# Patient Record
Sex: Female | Born: 1935 | Race: White | Hispanic: No | Smoking: Never smoker
Health system: Southern US, Community
[De-identification: ages and names within clinical notes are randomized; demographics above are authoritative.]

## PROBLEM LIST (undated history)

## (undated) DIAGNOSIS — F0391 Unspecified dementia with behavioral disturbance: Secondary | ICD-10-CM

## (undated) DIAGNOSIS — E079 Disorder of thyroid, unspecified: Secondary | ICD-10-CM

## (undated) DIAGNOSIS — K219 Gastro-esophageal reflux disease without esophagitis: Secondary | ICD-10-CM

## (undated) DIAGNOSIS — I442 Atrioventricular block, complete: Secondary | ICD-10-CM

## (undated) DIAGNOSIS — I10 Essential (primary) hypertension: Secondary | ICD-10-CM

## (undated) DIAGNOSIS — F419 Anxiety disorder, unspecified: Secondary | ICD-10-CM

## (undated) DIAGNOSIS — D649 Anemia, unspecified: Secondary | ICD-10-CM

## (undated) HISTORY — PX: CARDIAC PACEMAKER PLACEMENT: SHX583

---

## 2016-11-09 ENCOUNTER — Emergency Department: Payer: Medicare Other

## 2016-11-09 ENCOUNTER — Encounter: Payer: Self-pay | Admitting: Emergency Medicine

## 2016-11-09 ENCOUNTER — Inpatient Hospital Stay
Admission: EM | Admit: 2016-11-09 | Discharge: 2016-11-14 | DRG: 871 | Disposition: A | Discharge status: Skilled Nursing Facility | Payer: Medicare Other | Attending: Specialist | Admitting: Specialist

## 2016-11-09 DIAGNOSIS — E039 Hypothyroidism, unspecified: Secondary | ICD-10-CM | POA: Diagnosis present

## 2016-11-09 DIAGNOSIS — A4181 Sepsis due to Enterococcus: Secondary | ICD-10-CM | POA: Diagnosis present

## 2016-11-09 DIAGNOSIS — E870 Hyperosmolality and hypernatremia: Secondary | ICD-10-CM | POA: Diagnosis present

## 2016-11-09 DIAGNOSIS — K219 Gastro-esophageal reflux disease without esophagitis: Secondary | ICD-10-CM | POA: Diagnosis present

## 2016-11-09 DIAGNOSIS — L089 Local infection of the skin and subcutaneous tissue, unspecified: Secondary | ICD-10-CM

## 2016-11-09 DIAGNOSIS — I10 Essential (primary) hypertension: Secondary | ICD-10-CM | POA: Diagnosis present

## 2016-11-09 DIAGNOSIS — R1310 Dysphagia, unspecified: Secondary | ICD-10-CM | POA: Diagnosis present

## 2016-11-09 DIAGNOSIS — F0391 Unspecified dementia with behavioral disturbance: Secondary | ICD-10-CM | POA: Diagnosis not present

## 2016-11-09 DIAGNOSIS — E876 Hypokalemia: Secondary | ICD-10-CM | POA: Diagnosis present

## 2016-11-09 DIAGNOSIS — E860 Dehydration: Secondary | ICD-10-CM | POA: Diagnosis present

## 2016-11-09 DIAGNOSIS — F329 Major depressive disorder, single episode, unspecified: Secondary | ICD-10-CM | POA: Diagnosis present

## 2016-11-09 DIAGNOSIS — E43 Unspecified severe protein-calorie malnutrition: Secondary | ICD-10-CM | POA: Insufficient documentation

## 2016-11-09 DIAGNOSIS — A419 Sepsis, unspecified organism: Secondary | ICD-10-CM | POA: Diagnosis present

## 2016-11-09 DIAGNOSIS — Z79899 Other long term (current) drug therapy: Secondary | ICD-10-CM | POA: Diagnosis not present

## 2016-11-09 DIAGNOSIS — Z95810 Presence of automatic (implantable) cardiac defibrillator: Secondary | ICD-10-CM | POA: Diagnosis not present

## 2016-11-09 DIAGNOSIS — Z66 Do not resuscitate: Secondary | ICD-10-CM | POA: Diagnosis present

## 2016-11-09 DIAGNOSIS — R509 Fever, unspecified: Secondary | ICD-10-CM | POA: Diagnosis not present

## 2016-11-09 DIAGNOSIS — Z8249 Family history of ischemic heart disease and other diseases of the circulatory system: Secondary | ICD-10-CM | POA: Diagnosis not present

## 2016-11-09 DIAGNOSIS — I959 Hypotension, unspecified: Secondary | ICD-10-CM | POA: Diagnosis present

## 2016-11-09 DIAGNOSIS — Z7401 Bed confinement status: Secondary | ICD-10-CM

## 2016-11-09 DIAGNOSIS — D649 Anemia, unspecified: Secondary | ICD-10-CM | POA: Diagnosis present

## 2016-11-09 DIAGNOSIS — K1120 Sialoadenitis, unspecified: Secondary | ICD-10-CM | POA: Diagnosis present

## 2016-11-09 DIAGNOSIS — I348 Other nonrheumatic mitral valve disorders: Secondary | ICD-10-CM | POA: Diagnosis not present

## 2016-11-09 DIAGNOSIS — J690 Pneumonitis due to inhalation of food and vomit: Secondary | ICD-10-CM

## 2016-11-09 HISTORY — DX: Gastro-esophageal reflux disease without esophagitis: K21.9

## 2016-11-09 HISTORY — DX: Anemia, unspecified: D64.9

## 2016-11-09 HISTORY — DX: Anxiety disorder, unspecified: F41.9

## 2016-11-09 HISTORY — DX: Essential (primary) hypertension: I10

## 2016-11-09 HISTORY — DX: Unspecified dementia, unspecified severity, with behavioral disturbance: F03.91

## 2016-11-09 HISTORY — DX: Disorder of thyroid, unspecified: E07.9

## 2016-11-09 HISTORY — DX: Atrioventricular block, complete: I44.2

## 2016-11-09 LAB — COMPREHENSIVE METABOLIC PANEL
ALK PHOS: 102 U/L (ref 38–126)
ALT: 10 U/L — AB (ref 14–54)
AST: 25 U/L (ref 15–41)
Albumin: 2.7 g/dL — ABNORMAL LOW (ref 3.5–5.0)
Anion gap: 8 (ref 5–15)
BILIRUBIN TOTAL: 0.9 mg/dL (ref 0.3–1.2)
BUN: 30 mg/dL — ABNORMAL HIGH (ref 6–20)
CALCIUM: 8.8 mg/dL — AB (ref 8.9–10.3)
CO2: 20 mmol/L — ABNORMAL LOW (ref 22–32)
CREATININE: 0.91 mg/dL (ref 0.44–1.00)
Chloride: 110 mmol/L (ref 101–111)
GFR, EST AFRICAN AMERICAN: 60 mL/min — AB (ref >60)
GFR, EST NON AFRICAN AMERICAN: 52 mL/min — AB (ref >60)
Glucose, Bld: 105 mg/dL — ABNORMAL HIGH (ref 65–99)
Potassium: 3.5 mmol/L (ref 3.5–5.1)
Sodium: 138 mmol/L (ref 135–145)
Total Protein: 6.8 g/dL (ref 6.5–8.1)

## 2016-11-09 LAB — CBC WITH DIFFERENTIAL/PLATELET
Basophils Absolute: 0.4 10*3/uL — ABNORMAL HIGH (ref 0–0.1)
Basophils Relative: 1 %
EOS PCT: 0 %
Eosinophils Absolute: 0 10*3/uL (ref 0–0.7)
HEMATOCRIT: 28.3 % — AB (ref 35.0–47.0)
HEMOGLOBIN: 8.8 g/dL — AB (ref 12.0–16.0)
LYMPHS ABS: 0.6 10*3/uL — AB (ref 1.0–3.6)
LYMPHS PCT: 2 %
MCH: 25 pg — AB (ref 26.0–34.0)
MCHC: 31.2 g/dL — ABNORMAL LOW (ref 32.0–36.0)
MCV: 80.3 fL (ref 80.0–100.0)
Monocytes Absolute: 0.9 10*3/uL (ref 0.2–0.9)
Monocytes Relative: 2 %
NEUTROS ABS: 36.5 10*3/uL — AB (ref 1.4–6.5)
NEUTROS PCT: 95 %
Platelets: 291 10*3/uL (ref 150–440)
RBC: 3.52 MIL/uL — AB (ref 3.80–5.20)
RDW: 17.2 % — ABNORMAL HIGH (ref 11.5–14.5)
WBC: 38.5 10*3/uL — AB (ref 3.6–11.0)

## 2016-11-09 LAB — LACTIC ACID, PLASMA: Lactic Acid, Venous: 1.4 mmol/L (ref 0.5–1.9)

## 2016-11-09 LAB — PROCALCITONIN: Procalcitonin: 0.72 ng/mL

## 2016-11-09 MED ORDER — ACETAMINOPHEN 325 MG PO TABS: 650.0000 mg | ORAL_TABLET | Freq: Four times a day (QID) | ORAL | Status: DC | PRN

## 2016-11-09 MED ORDER — IPRATROPIUM-ALBUTEROL 0.5-2.5 (3) MG/3ML IN SOLN
3.0000 mL | Freq: Four times a day (QID) | RESPIRATORY_TRACT | Status: DC
  Administered 2016-11-09 – 2016-11-11 (×7): 3 mL via RESPIRATORY_TRACT
  Filled 2016-11-09 (×7): qty 3

## 2016-11-09 MED ORDER — SODIUM CHLORIDE 0.9 % IV SOLN
3.0000 g | Freq: Two times a day (BID) | INTRAVENOUS | Status: DC
  Administered 2016-11-10: 3 g via INTRAVENOUS
  Filled 2016-11-09 (×2): qty 3

## 2016-11-09 MED ORDER — QUETIAPINE FUMARATE 100 MG PO TABS
150.0000 mg | ORAL_TABLET | Freq: Every day | ORAL | Status: DC
  Administered 2016-11-12 – 2016-11-13 (×2): 150 mg via ORAL
  Filled 2016-11-09: qty 1.5
  Filled 2016-11-09: qty 6
  Filled 2016-11-09: qty 2
  Filled 2016-11-09: qty 6

## 2016-11-09 MED ORDER — DEXAMETHASONE SODIUM PHOSPHATE 10 MG/ML IJ SOLN
10.0000 mg | Freq: Three times a day (TID) | INTRAMUSCULAR | Status: DC
  Administered 2016-11-09 – 2016-11-10 (×2): 10 mg via INTRAVENOUS
  Filled 2016-11-09 (×4): qty 1

## 2016-11-09 MED ORDER — LEVOTHYROXINE SODIUM 50 MCG PO TABS
50.0000 ug | ORAL_TABLET | Freq: Every day | ORAL | Status: DC
  Administered 2016-11-10 – 2016-11-14 (×5): 50 ug via ORAL
  Filled 2016-11-09 (×5): qty 1

## 2016-11-09 MED ORDER — NYSTATIN 100000 UNIT/GM EX CREA
1.0000 "application " | TOPICAL_CREAM | Freq: Two times a day (BID) | CUTANEOUS | Status: DC
  Administered 2016-11-09: 1 via TOPICAL
  Filled 2016-11-09: qty 15

## 2016-11-09 MED ORDER — IOPAMIDOL (ISOVUE-300) INJECTION 61%
75.0000 mL | Freq: Once | INTRAVENOUS | Status: AC | PRN
  Administered 2016-11-09: 75 mL via INTRAVENOUS

## 2016-11-09 MED ORDER — SODIUM CHLORIDE 0.9 % IV BOLUS (SEPSIS)
500.0000 mL | Freq: Once | INTRAVENOUS | Status: AC
  Administered 2016-11-09: 500 mL via INTRAVENOUS

## 2016-11-09 MED ORDER — POTASSIUM CHLORIDE IN NACL 20-0.9 MEQ/L-% IV SOLN
INTRAVENOUS | Status: DC
  Administered 2016-11-09 – 2016-11-11 (×2): via INTRAVENOUS
  Filled 2016-11-09 (×4): qty 1000

## 2016-11-09 MED ORDER — SODIUM CHLORIDE 0.9 % IV SOLN
3.0000 g | Freq: Once | INTRAVENOUS | Status: AC
  Administered 2016-11-09: 3 g via INTRAVENOUS
  Filled 2016-11-09: qty 3

## 2016-11-09 MED ORDER — ESCITALOPRAM OXALATE 10 MG PO TABS
10.0000 mg | ORAL_TABLET | Freq: Every day | ORAL | Status: DC
  Administered 2016-11-10 – 2016-11-14 (×5): 10 mg via ORAL
  Filled 2016-11-09 (×5): qty 1

## 2016-11-09 MED ORDER — ACETAMINOPHEN 650 MG RE SUPP: 650.0000 mg | Freq: Four times a day (QID) | RECTAL | Status: DC | PRN

## 2016-11-09 MED ORDER — DEXAMETHASONE SODIUM PHOSPHATE 10 MG/ML IJ SOLN
10.0000 mg | Freq: Once | INTRAMUSCULAR | Status: AC
  Administered 2016-11-09: 10 mg via INTRAVENOUS
  Filled 2016-11-09 (×2): qty 1

## 2016-11-09 NOTE — ED Notes (Signed)
2nd half of the 500 cc bolus started (250 cc bags). Per [facility] nurse the pt likes to be called Christine [name] and also is a little hard of hearing in the rt ear.

## 2016-11-09 NOTE — ED Triage Notes (Signed)
Pt was sent from [facility] to see an ent at [facility] today. Per report pt has had a changed of mental status x 3 days and sore throat.

## 2016-11-09 NOTE — ED Notes (Signed)
Hold atb until blood cultures drawn.

## 2016-11-09 NOTE — Consult Note (Signed)
..   Christine [name], Christine [name] [birth year] Christine SemenGraydon Goodman, MD  Reason for Consult: sore throat,   HPI: 82 y.o. Female with history of dementia presented to my office from PCP for evaluation of "fever, wheezing, stridor, purulence from mouth".  Patient is a resident of [facility] and provider with patient reported at my office her altered mental status was a change from normal.  Due to AMS, fever, and signs of signs of infection, EMS called to transport patient to ED for evaluation and management.  Patient has undergone CT scan and labwork showing significant leukocytosis and anemia.  Allergies: No Known Allergies  ROS: Review of systems normal other than 12 systems except per HPI.  PMH:  Past Medical History:  Diagnosis Date  . Anemia   . Anxiety   . Atrioventricular block, complete (HCC)   . GERD (gastroesophageal reflux disease)   . Hypertension   . Thyroid disease   . Unspecified dementia with behavioral disturbance     FH: History reviewed. No pertinent family history.  SH:  Social History   Social History  . Marital status: Widowed    Spouse name: N/A  . Number of children: N/A  . Years of education: N/A   Occupational History  . Not on file.   Social History Main Topics  . Smoking status: Unknown If Ever Smoked  . Smokeless tobacco: Not on file  . Alcohol use No  . Drug use: No  . Sexual activity: Not on file   Other Topics Concern  . Not on file   Social History Narrative  . No narrative on file    PSH:  Past Surgical History:  Procedure Laterality Date  . CARDIAC PACEMAKER PLACEMENT      Physical  Exam:  GEN-  Somnolent female, supine in bed EARS- external ears clear OC/OP-  Severe xerostomia, right tonsil with purulence and erythema, limited evaluation due to patient's cooperation NECK-  Right submandibular induration and erythema, no obvious fluctuance RESP- unlabored  CT scan reviewed-  Right oropharyngeal, floor of mouth, and neck edema  and hypodensity consistent with phlegmon   A/P: Probably right submandibular sialoadenitis secondary to severe xerostomia  Plan:  1)  Recommend admit to Medicine given medical status 2)  Fluid hydration with IV abx. 3)  Agree with IV abx (unasyn vs vanc) 4)  Decadron IV 5)  Warm compresses 6)  Sialogogues once more alert and able to tolerate PO.   Tessy Pawelski 11/09/2016 5:26 PM

## 2016-11-09 NOTE — Progress Notes (Signed)
Pharmacy Antibiotic Note  Christine [name] is a 82 y.o. female admitted on 11/09/2016 with Aspiration  pneumonia.  Pharmacy has been consulted for Unasyn dosing. Patient received Unasyn 3gm IV x1 dose in ED  Plan: Will start patient on Unasyn 3g IV every 12 hours based on current CrCl <8230ml/min.  Will monitor renal function and adjust dose as needed.   Height: 5\' 3"  (160 cm) Weight: 109 lb 8 oz (49.7 kg) IBW/kg (Calculated) : 52.4  Temp (24hrs), Avg:97.5 F (36.4 C), Min:97.5 F (36.4 C), Max:97.5 F (36.4 C)   Recent Labs Lab 11/09/16 1549  WBC 38.5*  CREATININE 0.91    Estimated Creatinine Clearance: 28.4 mL/min (by C-G formula based on SCr of 0.91 mg/dL).    No Known Allergies  Antimicrobials this admission: 12/14 Unasyn  >>   Dose adjustments this admission:   Microbiology results: 12/14 BCx: pending  Thank you for allowing pharmacy to be a part of this patient's care.  Gardner CandleSheema M Maximum Reiland, PharmD, BCPS Clinical Pharmacist 11/09/2016 6:04 PM

## 2016-11-09 NOTE — H&P (Signed)
[facility] [place] [facility]   PATIENT NAME: Christine LeffBertha [name]    MR#:  [id number]  DATE OF BIRTH:  August 04, 1918  DATE OF ADMISSION:  11/09/2016  PRIMARY CARE PHYSICIAN: Dorothey BasemanAVID BRONSTEIN, MD   REQUESTING/REFERRING PHYSICIAN: Dr Tobe SosGraden Goodman  CHIEF COMPLAINT:   Chief Complaint  Patient presents with  . Neck Pain    HISTORY OF PRESENT ILLNESS:  Christine [name]  is a 82 y.o. female with a known history of Dementia, pacemaker. Patient unable to give any history at this time. Daughter is currently in the [place] on vacation. On Tuesday she started getting hoarse. On Wednesday wheeze and a cough. Chest x-ray showed pneumonitis and was started on a Z-Pak and breathing treatments. This a.m. they noticed some swelling and edema around her neck and she is spitting up cottage cheese-type material. They sent her to ENT Dr. Gregary CromerVaught's office and she was sent into the ER for admission. In the ER blood pressure was on the lower side, white count elevated and tachycardic and hospitalist services were contacted for admission area normally she is able to communicate but is total care as per nurse at the facility.  PAST MEDICAL HISTORY:   Past Medical History:  Diagnosis Date  . Anemia   . Anxiety   . Atrioventricular block, complete (HCC)   . GERD (gastroesophageal reflux disease)   . Hypertension   . Thyroid disease   . Unspecified dementia with behavioral disturbance     PAST SURGICAL HISTORY:   Past Surgical History:  Procedure Laterality Date  . CARDIAC PACEMAKER PLACEMENT      SOCIAL HISTORY:   Social History  Substance Use Topics  . Smoking status: Never Smoker  . Smokeless tobacco: Never Used  . Alcohol use No    FAMILY HISTORY:   Family History  Problem Relation Age of Onset  . Hypertension Mother     DRUG ALLERGIES:  No Known Allergies  REVIEW OF SYSTEMS:  Unable to obtain at this time  MEDICATIONS AT HOME:   Prior to Admission  medications   Medication Sig Start Date End Date Taking? Authorizing Provider  acetaminophen (TYLENOL) 500 MG tablet Take 1,000 mg by mouth every 6 (six) hours as needed.   Yes Historical Provider, MD  amLODipine (NORVASC) 2.5 MG tablet Take 2.5 mg by mouth daily.   Yes Historical Provider, MD  apixaban (ELIQUIS) 2.5 MG TABS tablet Take 2.5 mg by mouth 2 (two) times daily.   Yes Historical Provider, MD  azithromycin (ZITHROMAX) 250 MG tablet Take by mouth daily. 11/08/16 11/12/16 Yes Historical Provider, MD  calcium carbonate (OSCAL) 1500 (600 Ca) MG TABS tablet Take by mouth 2 (two) times daily with a meal.   Yes Historical Provider, MD  cholecalciferol (VITAMIN D) 1000 units tablet Take 1,000 Units by mouth daily.   Yes Historical Provider, MD  Cranberry 450 MG CAPS Take 1 tablet by mouth daily.   Yes Historical Provider, MD  escitalopram (LEXAPRO) 10 MG tablet Take 10 mg by mouth daily.   Yes Historical Provider, MD  famotidine (PEPCID) 10 MG tablet Take 10 mg by mouth 2 (two) times daily.   Yes Historical Provider, MD  ferrous sulfate 325 (65 FE) MG tablet Take 325 mg by mouth 3 (three) times daily with meals.   Yes Historical Provider, MD  ipratropium-albuterol (DUONEB) 0.5-2.5 (3) MG/3ML SOLN Take 3 mLs by nebulization every 4 (four) hours as needed.   Yes Historical Provider, MD  levothyroxine (SYNTHROID, LEVOTHROID) 50  MCG tablet Take 50 mcg by mouth daily before breakfast.   Yes Historical Provider, MD  Melatonin 3 MG TABS Take by mouth.   Yes Historical Provider, MD  Multiple Vitamins-Minerals (MULTIVITAMIN WITH MINERALS) tablet Take 1 tablet by mouth daily.   Yes Historical Provider, MD  nitrofurantoin, macrocrystal-monohydrate, (MACROBID) 100 MG capsule Take 100 mg by mouth 2 (two) times daily.   Yes Historical Provider, MD  Nutritional Supplements (NUTRITIONAL SHAKE PO) Take 4 oz by mouth 3 (three) times daily.   Yes Historical Provider, MD  nystatin cream (MYCOSTATIN) Apply 1  application topically 2 (two) times daily.   Yes Historical Provider, MD  QUEtiapine (SEROQUEL) 100 MG tablet Take 100 mg by mouth 2 (two) times daily.   Yes Historical Provider, MD  QUEtiapine (SEROQUEL) 300 MG tablet Take 150 mg by mouth at bedtime.   Yes Historical Provider, MD  senna-docusate (SENOKOT-S) 8.6-50 MG tablet Take 1 tablet by mouth 2 (two) times daily.   Yes Historical Provider, MD  triamcinolone (KENALOG) 0.025 % cream Apply 1 application topically 2 (two) times daily.   Yes Historical Provider, MD  vitamin B-12 (CYANOCOBALAMIN) 1000 MCG tablet Take 1,000 mcg by mouth daily.   Yes Historical Provider, MD      VITAL SIGNS:  Blood pressure (!) 98/53, pulse (!) 58, temperature 97.5 F (36.4 C), temperature source Oral, resp. rate 20, height 5\' 3"  (1.6 m), weight 49.7 kg (109 lb 8 oz), SpO2 99 %.  PHYSICAL EXAMINATION:  GENERAL:  82 y.o.-year-old patient lying in the bed with no acute distress.  EYES: Pupils equal, round, reactive to light. No scleral icterus.  HEENT: Head atraumatic, normocephalic. Throat dry. NECK:  Supple, no jugular venous distention. Swelling submandible bilaterally LUNGS: Decreased breath sounds bilaterally, expiratory wheezing. No rales,rhonchi or crepitation. No use of accessory muscles of respiration.  CARDIOVASCULAR: S1, S2 normal. No murmurs, rubs, or gallops.  ABDOMEN: Soft, nontender, nondistended. Bowel sounds present. No organomegaly or mass.  EXTREMITIES: No pedal edema, cyanosis, or clubbing.  NEUROLOGIC: Patient moved her own extremities PSYCHIATRIC: The patient is lethargic.  SKIN: No rash, lesion, or ulcer.   LABORATORY PANEL:   CBC  Recent Labs Lab 11/09/16 1549  WBC 38.5*  HGB 8.8*  HCT 28.3*  PLT 291   ------------------------------------------------------------------------------------------------------------------  Chemistries   Recent Labs Lab 11/09/16 1549  NA 138  K 3.5  CL 110  CO2 20*  GLUCOSE 105*  BUN 30*   CREATININE 0.91  CALCIUM 8.8*  AST 25  ALT 10*  ALKPHOS 102  BILITOT 0.9   ------------------------------------------------------------------------------------------------------------------    RADIOLOGY:  Ct Soft Tissue Neck W Contrast  Result Date: 11/09/2016 CLINICAL DATA:  Right neck swelling. Sore throat and altered mental status. EXAM: CT NECK WITH CONTRAST TECHNIQUE: Multidetector CT imaging of the neck was performed using the standard protocol following the bolus administration of intravenous contrast. CONTRAST:  75mL ISOVUE-300 IOPAMIDOL (ISOVUE-300) INJECTION 61% COMPARISON:  None. FINDINGS: Pharynx and larynx: Submucosal edema in the lower pharynx and supraglottic larynx. Mild mass effect on the right lateral pharynx by the right submandibular space process. Right tonsillar calcification. Salivary glands: There is extensive heterogeneous soft tissue throughout the right submandibular space with both hypoenhancing and hyperenhancing material suggestive of phlegmon. The right submandibular gland is not discretely identified. Inflammatory stranding extends into the subcutaneous soft tissues of the face and upper neck. Stranding also extends posteriorly into the carotid space. The airway remains patent. No discrete, well-defined drainable fluid collection is identified. The left  submandibular gland and right parotid gland are unremarkable. There is an 11 mm peripherally enhancing nodule in the superficial left parotid gland. A similar but smaller nodule measures 4 mm more inferiorly in the left parotid. The left parotid gland is slightly enlarged compared to the right. Thyroid: Unremarkable. Lymph nodes: Small but asymmetric right submandibular lymph nodes measure up to 5 mm in short axis. Small right level II lymph nodes measure up to 6 mm in short axis, and there are asymmetric right level III and IV lymph nodes which are sub 5 mm. These are all most likely reactive. Vascular: Major vascular  structures of the neck appear patent. Extensive calcified plaque is present at both carotid bifurcations with at least moderate proximal ICA stenosis on the right and mild-to-moderate stenosis on the left. Limited intracranial: Partially visualized cerebral atrophy. Visualized orbits: Unremarkable. Mastoids and visualized paranasal sinuses: Clear. Skeleton: Moderately advanced disc and facet degeneration in the cervical spine. T2 compression fracture with 70% anterior vertebral body height loss. This is chronic in appearance, with ankylosis between the T2 and T3 vertebral bodies anteriorly and without evidence of significant paravertebral hematoma. Posterior T2 vertebral body retropulsion measures 4 mm. Upper chest: Partially visualized heterogeneous lung opacities in the posterior left upper lobe and superior segment of the left lower lobe with small volume left pleural fluid. Aortic atherosclerosis. Right subclavian approach pacemaker. Subcentimeter mediastinal lymph nodes. Other: None. IMPRESSION: 1. Extensive phlegmonous type changes in the right submandibular space which may reflect marked infectious sialadenitis. No organized abscess identified. 2. Pharyngeal and supraglottic edema.  Patent airway. 3. Small nodules in the left parotid gland, the larger measuring 11 mm. This may reflect a primary parotid neoplasm, abnormal lymph node, or potentially focal inflammatory change in the setting of mild acute parotitis given mild asymmetric enlargement of the left parotid gland and edema extending into the left face. 4. Partially visualized lung opacities in the left upper and left lower lobes suspicious for pneumonia. Chest radiographs recommended. 5. Partially visualized left pleural effusion. 6. Aortic atherosclerosis. Carotid bifurcation atherosclerosis with right greater than left proximal ICA stenosis. 7. Moderate T2 compression fracture, chronic in appearance. Electronically Signed   By: Sebastian AcheAllen  Grady M.D.    On: 11/09/2016 17:34      IMPRESSION AND PLAN:   1. Clinical sepsis with aspiration pneumonia and submandibular infection. Patient also has leukocytosis, tachycardia and relative hypotension. I will give a fluid bolus and gentle IV fluids overnight. Antibiotics with IV Unasyn and IV Decadron. Appreciate ENT follow-up. Nothing by mouth and swallow evaluation tomorrow. Hold eliquis just in case surgical procedure. 2. Dehydration give IV fluid hydration overnight 3. Hypokalemia potassium in IV fluids 4. History of dementia with behavioral disturbance. 5. GERD on Pepcid 6. Hypothyroidism unspecified on levothyroxine 7. Anemia. Send off iron studies and stool guaiac. Watch closely with hydration  All the records are reviewed and case discussed with ED provider. Management plans discussed with the patient, family and they are in agreement.  CODE STATUS: DO NOT RESUSCITATE  TOTAL TIME TAKING CARE OF THIS PATIENT: 50 minutes.    Alford HighlandWIETING, Keilyn Haggard M.D on 11/09/2016 at 6:01 PM  Between 7am to 6pm - Pager - [phone]  After 6pm call admission pager [phone]  [facility] Office  [phone]  CC: Primary care physician; Dorothey BasemanAVID BRONSTEIN, MD

## 2016-11-09 NOTE — ED Provider Notes (Signed)
[facility] Emergency Department Provider Note   ____________________________________________   I have reviewed the triage vital signs and the nursing notes.   HISTORY  Chief Complaint Neck swelling  History limited by: poor historian   HPI Christine [name] is a 82 y.o. female who presents to the emergency department today from ENT clinic because of ENT clinic concern for AMS, however per report from living facility there has not been any AMS. Patient is at baseline. They were being sent to ENT for swelling to the right neck. Patient states that it is slightly painful. Unclear how long it has been present for. There is some associated erythema.   Past Medical History:  Diagnosis Date  . Anemia   . Anxiety   . Atrioventricular block, complete (HCC)   . GERD (gastroesophageal reflux disease)   . Hypertension   . Thyroid disease   . Unspecified dementia with behavioral disturbance     There are no active problems to display for this patient.   Past Surgical History:  Procedure Laterality Date  . CARDIAC PACEMAKER PLACEMENT      Prior to Admission medications   Not on File    Allergies Patient has no known allergies.  History reviewed. No pertinent family history.  Social History Social History  Substance Use Topics  . Smoking status: Unknown If Ever Smoked  . Smokeless tobacco: Not on file  . Alcohol use No    Review of Systems  Constitutional: Negative for fever. Cardiovascular: Negative for chest pain. Respiratory: Negative for shortness of breath. Gastrointestinal: Negative for abdominal pain, vomiting and diarrhea. Genitourinary: Negative for dysuria. Musculoskeletal: Positive for right neck swelling. Skin: Positive for erythema.  Neurological: Negative for headaches, focal weakness or numbness.  10-point ROS otherwise negative.  ____________________________________________   PHYSICAL EXAM:  VITAL SIGNS: ED Triage  Vitals  Enc Vitals Group     BP 11/09/16 1512 112/62     Pulse Rate 11/09/16 1512 96     Resp 11/09/16 1512 20     Temp 11/09/16 1512 97.5 F (36.4 C)     Temp Source 11/09/16 1512 Oral     SpO2 11/09/16 1512 98 %     Weight 11/09/16 1515 109 lb 8 oz (49.7 kg)     Height 11/09/16 1515 5\' 3"  (1.6 m)     Head Circumference --      [facility] --      Pain Score --      Pain Loc --      Pain Edu? --      Excl. in GC? --    Constitutional: Awake, alert. No acute distress.  Eyes: Conjunctivae are normal. Normal extraocular movements. ENT   Head: Normocephalic and atraumatic.   Nose: No congestion/rhinnorhea.   Mouth/Throat: Mucous membranes are moist.   Neck: No stridor. Right neck swelling, with some erythema overlying the area. Slightly tender to palpation. Hematological/Lymphatic/Immunilogical: No cervical lymphadenopathy. Cardiovascular: Normal rate, regular rhythm.  No murmurs, rubs, or gallops.  Respiratory: Normal respiratory effort without tachypnea nor retractions. Breath sounds are clear and equal bilaterally. No wheezes/rales/rhonchi. Gastrointestinal: Soft and non tender. No rebound. No guarding.  Genitourinary: Deferred Musculoskeletal: Normal range of motion in all extremities. No lower extremity edema. Neurologic:  Slightly hoarse voice. No gross focal neurologic deficits are appreciated.  Skin:  Skin is warm, dry and intact. No rash noted. Psychiatric: Mood and affect are normal. Speech and behavior are normal. Patient exhibits appropriate insight and judgment.  ____________________________________________    LABS (pertinent positives/negatives)  Labs Reviewed  CBC WITH DIFFERENTIAL/PLATELET - Abnormal; Notable for the following:       Result Value   WBC 38.5 (*)    RBC 3.52 (*)    Hemoglobin 8.8 (*)    HCT 28.3 (*)    MCH 25.0 (*)    MCHC 31.2 (*)    RDW 17.2 (*)    Neutro Abs 36.5 (*)    Lymphs Abs 0.6 (*)    Basophils Absolute 0.4 (*)     All other components within normal limits  COMPREHENSIVE METABOLIC PANEL - Abnormal; Notable for the following:    CO2 20 (*)    Glucose, Bld 105 (*)    BUN 30 (*)    Calcium 8.8 (*)    Albumin 2.7 (*)    ALT 10 (*)    GFR calc non Af Amer 52 (*)    GFR calc Af Amer 60 (*)    All other components within normal limits  CULTURE, BLOOD (ROUTINE X 2)  CULTURE, BLOOD (ROUTINE X 2)  PROCALCITONIN  LACTIC ACID, PLASMA  BASIC METABOLIC PANEL  CBC     ____________________________________________   EKG  None  ____________________________________________    RADIOLOGY  CT soft tissue neck IMPRESSION:  1. Extensive phlegmonous type changes in the right submandibular  space which may reflect marked infectious sialadenitis. No organized  abscess identified.  2. Pharyngeal and supraglottic edema. Patent airway.  3. Small nodules in the left parotid gland, the larger measuring 11  mm. This may reflect a primary parotid neoplasm, abnormal lymph  node, or potentially focal inflammatory change in the setting of  mild acute parotitis given mild asymmetric enlargement of the left  parotid gland and edema extending into the left face.  4. Partially visualized lung opacities in the left upper and left  lower lobes suspicious for pneumonia. Chest radiographs recommended.  5. Partially visualized left pleural effusion.  6. Aortic atherosclerosis. Carotid bifurcation atherosclerosis with  right greater than left proximal ICA stenosis.  7. Moderate T2 compression fracture, chronic in appearance.   ____________________________________________   PROCEDURES  Procedures  ____________________________________________   INITIAL IMPRESSION / ASSESSMENT AND PLAN / ED COURSE  Pertinent labs & imaging results that were available during my care of the patient were reviewed by me and considered in my medical decision making (see chart for details).  Patient presents to the emergency  department because of concern for right neck swelling. WBC quite elevated. Patient was seen by ENT who recommends admission and antibiotics. They will consider drainage tomorrow.  ____________________________________________   FINAL CLINICAL IMPRESSION(S) / ED DIAGNOSES  Final diagnoses:  Neck infection     Note: This dictation was prepared with Dragon dictation. Any transcriptional errors that result from this process are unintentional     Phineas SemenGraydon Sosha Shepherd, MD 11/09/16 2144

## 2016-11-09 NOTE — ED Notes (Signed)
unasyn started once blood cultures, ammonia and lactic acid labs all drawn.

## 2016-11-09 NOTE — ED Notes (Signed)
Spoke with [facility]. Per report the pt was sent over for the red hardened area on the rt side of neck and hoarse voice. Per report this is her normal mental state in which she yells out and is normally a&ox1.

## 2016-11-10 DIAGNOSIS — E43 Unspecified severe protein-calorie malnutrition: Secondary | ICD-10-CM | POA: Insufficient documentation

## 2016-11-10 LAB — CBC
HEMATOCRIT: 26.7 % — AB (ref 35.0–47.0)
HEMOGLOBIN: 8.4 g/dL — AB (ref 12.0–16.0)
MCH: 25.3 pg — ABNORMAL LOW (ref 26.0–34.0)
MCHC: 31.6 g/dL — AB (ref 32.0–36.0)
MCV: 80.2 fL (ref 80.0–100.0)
Platelets: 278 10*3/uL (ref 150–440)
RBC: 3.33 MIL/uL — ABNORMAL LOW (ref 3.80–5.20)
RDW: 17.5 % — AB (ref 11.5–14.5)
WBC: 22.1 10*3/uL — ABNORMAL HIGH (ref 3.6–11.0)

## 2016-11-10 LAB — BLOOD CULTURE ID PANEL (REFLEXED)
ACINETOBACTER BAUMANNII: NOT DETECTED
CANDIDA ALBICANS: NOT DETECTED
Candida glabrata: NOT DETECTED
Candida krusei: NOT DETECTED
Candida parapsilosis: NOT DETECTED
Candida tropicalis: NOT DETECTED
ENTEROBACTERIACEAE SPECIES: NOT DETECTED
ENTEROCOCCUS SPECIES: DETECTED — AB
Enterobacter cloacae complex: NOT DETECTED
Escherichia coli: NOT DETECTED
HAEMOPHILUS INFLUENZAE: NOT DETECTED
Klebsiella oxytoca: NOT DETECTED
Klebsiella pneumoniae: NOT DETECTED
Listeria monocytogenes: NOT DETECTED
NEISSERIA MENINGITIDIS: NOT DETECTED
PSEUDOMONAS AERUGINOSA: NOT DETECTED
Proteus species: NOT DETECTED
SERRATIA MARCESCENS: NOT DETECTED
STAPHYLOCOCCUS AUREUS BCID: NOT DETECTED
STAPHYLOCOCCUS SPECIES: NOT DETECTED
STREPTOCOCCUS PNEUMONIAE: NOT DETECTED
STREPTOCOCCUS PYOGENES: NOT DETECTED
STREPTOCOCCUS SPECIES: NOT DETECTED
Streptococcus agalactiae: NOT DETECTED
Vancomycin resistance: NOT DETECTED

## 2016-11-10 LAB — BASIC METABOLIC PANEL
Anion gap: 8 (ref 5–15)
BUN: 27 mg/dL — AB (ref 6–20)
CHLORIDE: 113 mmol/L — AB (ref 101–111)
CO2: 19 mmol/L — ABNORMAL LOW (ref 22–32)
Calcium: 8.3 mg/dL — ABNORMAL LOW (ref 8.9–10.3)
Creatinine, Ser: 0.8 mg/dL (ref 0.44–1.00)
GFR calc Af Amer: >60 mL/min (ref >60)
GFR calc non Af Amer: >60 mL/min (ref >60)
GLUCOSE: 131 mg/dL — AB (ref 65–99)
POTASSIUM: 4 mmol/L (ref 3.5–5.1)
Sodium: 140 mmol/L (ref 135–145)

## 2016-11-10 LAB — MRSA PCR SCREENING: MRSA by PCR: POSITIVE — AB

## 2016-11-10 MED ORDER — SODIUM CHLORIDE 0.9 % IV SOLN
3.0000 g | Freq: Four times a day (QID) | INTRAVENOUS | Status: DC
  Administered 2016-11-10 – 2016-11-14 (×17): 3 g via INTRAVENOUS
  Filled 2016-11-10 (×21): qty 3

## 2016-11-10 MED ORDER — PNEUMOCOCCAL VAC POLYVALENT 25 MCG/0.5ML IJ INJ
0.5000 mL | INJECTION | INTRAMUSCULAR | Status: DC
  Filled 2016-11-10: qty 0.5

## 2016-11-10 MED ORDER — DEXAMETHASONE SODIUM PHOSPHATE 4 MG/ML IJ SOLN
4.0000 mg | Freq: Three times a day (TID) | INTRAMUSCULAR | Status: DC
  Administered 2016-11-10 – 2016-11-14 (×12): 4 mg via INTRAVENOUS
  Filled 2016-11-10 (×12): qty 1

## 2016-11-10 MED ORDER — MUPIROCIN 2 % EX OINT
1.0000 "application " | TOPICAL_OINTMENT | Freq: Two times a day (BID) | CUTANEOUS | Status: DC
  Administered 2016-11-10 – 2016-11-14 (×9): 1 via NASAL
  Filled 2016-11-10: qty 22

## 2016-11-10 MED ORDER — VANCOMYCIN HCL IN DEXTROSE 750-5 MG/150ML-% IV SOLN
750.0000 mg | INTRAVENOUS | Status: DC
  Filled 2016-11-10: qty 150

## 2016-11-10 MED ORDER — ORAL CARE MOUTH RINSE
15.0000 mL | Freq: Two times a day (BID) | OROMUCOSAL | Status: DC
  Administered 2016-11-10 – 2016-11-14 (×7): 15 mL via OROMUCOSAL

## 2016-11-10 MED ORDER — ENOXAPARIN SODIUM 40 MG/0.4ML ~~LOC~~ SOLN
40.0000 mg | SUBCUTANEOUS | Status: DC
  Administered 2016-11-10 – 2016-11-13 (×4): 40 mg via SUBCUTANEOUS
  Filled 2016-11-10 (×4): qty 0.4

## 2016-11-10 MED ORDER — CHLORHEXIDINE GLUCONATE CLOTH 2 % EX PADS
6.0000 | MEDICATED_PAD | Freq: Every day | CUTANEOUS | Status: AC
  Administered 2016-11-10 – 2016-11-14 (×5): 6 via TOPICAL

## 2016-11-10 NOTE — Progress Notes (Addendum)
Initial Nutrition Assessment  DOCUMENTATION CODES:   Severe malnutrition in context of chronic illness  INTERVENTION:   Magic cup TID with meals, each supplement provides 290 kcal and 9 grams of protein  Mighty Shake II TID with meals, each supplement provides 480-500 kcals and 20-23 grams of protein  NUTRITION DIAGNOSIS:   Malnutrition related to lethargy/confusion, chronic illness as evidenced by moderate depletion of body fat, severe depletion of muscle mass.  GOAL:   Patient will meet greater than or equal to 90% of their needs  MONITOR:   PO intake, Supplement acceptance  REASON FOR ASSESSMENT:   Malnutrition Screening Tool    ASSESSMENT:   82 y.o. female with a known history of Dementia & pacemaker. Admitted for pneumonia and swollen neck with possible abcess.    Met with pt in room today. Pt confused and screaming "let me sleep". Unable to obtain nutrition history for this pt. Pt currently NPO. Spoke to speech who recommends Magic Cups and [employer] for this pt. SLP reports pt does not like to have food placed in her mouth. No BM since admit. No weight history per chart. Unsure if any weight loss.    Medications reviewed and include: unasyn, dexamethasone, synthroid    Labs reviewed: CL 113(H), CO2 19(L), BUN 27(H), Ca 8.3(L) adj. 9.34 wnl, Alb 2.7(L) Wbc- 22.1(H), hgb 8.4(L), Hct 26.7(L)  Nutrition-Focused physical exam completed. Findings are mild/moderate fat depletion, severe muscle depletion, and no edema.   Diet Order:  DIET - DYS 1 Room service appropriate? No; Fluid consistency: Thin  Skin:  Reviewed, no issues  Last BM:  none since admit  Height:   Ht Readings from Last 1 Encounters:  11/09/16 5' 3"  (1.6 m)    Weight:   Wt Readings from Last 1 Encounters:  11/09/16 112 lb 2 oz (50.9 kg)    Ideal Body Weight:  52.2 kg  BMI:  Body mass index is 19.86 kg/m.  Estimated Nutritional Needs:   Kcal:  1400-1700kcal/day   Protein:   61-76g/day   Fluid:  >1.5L/day   EDUCATION NEEDS:   No education needs identified at this time  Koleen Distance, RD, LDN

## 2016-11-10 NOTE — Progress Notes (Signed)
Pharmacy Antibiotic Note  Christine LeffBertha [name] is a 82 y.o. female admitted on 11/09/2016 with Aspiration  pneumonia.  Pharmacy has been consulted for Unasyn dosing.   Plan: Slight improvement in SCr. Will increase Unasyn to 3 g iv q 6 hours.   Height: 5\' 3"  (160 cm) Weight: 112 lb 2 oz (50.9 kg) IBW/kg (Calculated) : 52.4  Temp (24hrs), Avg:97.5 F (36.4 C), Min:97.4 F (36.3 C), Max:97.6 F (36.4 C)   Recent Labs Lab 11/09/16 1549 11/09/16 1827 11/10/16 0447  WBC 38.5*  --  22.1*  CREATININE 0.91  --  0.80  LATICACIDVEN  --  1.4  --     Estimated Creatinine Clearance: 33 mL/min (by C-G formula based on SCr of 0.8 mg/dL).    No Known Allergies  Antimicrobials this admission: 12/14 Unasyn  >>   Dose adjustments this admission:  Microbiology results: 12/14 BCx: pending 12/15 MRSA PCR: positive  Thank you for allowing pharmacy to be a part of this patient's care.  Luisa HartScott Charmelle Soh, PharmD Clinical Pharmacist  11/10/2016 8:54 AM

## 2016-11-10 NOTE — Care Management Important Message (Signed)
Important Message  Patient Details  Name: Christine [name] MRN: [id number] Date of Birth: 1918/04/25   Medicare Important Message Given:  Yes    Gwenette GreetBrenda S Karandeep Resende, RN 11/10/2016, 10:19 AM

## 2016-11-10 NOTE — Consult Note (Signed)
[facility] Infectious Disease     Reason for Consult:Enterococcal bacteremia   Referring Physician: Karlton Lemon Date of Admission:  11/09/2016   Active Problems:   Sepsis ([place])   HPI: Christine [name] is a 82 y.o. female admitted from PCP with hoarseness, possible aspiration. She has seen ENt and had sialoadenitis dxed. Bcx + enterococcus. Started on unasyn. On admit wbc was 38, but afebrile She has dementia, HTN and does have a ppm in place.   Past Medical History:  Diagnosis Date  . Anemia   . Anxiety   . Atrioventricular block, complete ([place])   . GERD (gastroesophageal reflux disease)   . Hypertension   . Thyroid disease   . Unspecified dementia with behavioral disturbance    Past Surgical History:  Procedure Laterality Date  . CARDIAC PACEMAKER PLACEMENT     Social History  Substance Use Topics  . Smoking status: Never Smoker  . Smokeless tobacco: Never Used  . Alcohol use No   Family History  Problem Relation Age of Onset  . Hypertension Mother     Allergies: No Known Allergies  Current antibiotics: Antibiotics Given (last 72 hours)    Date/Time Action Medication Dose Rate   11/10/16 0559 Given   Ampicillin-Sulbactam (UNASYN) 3 g in sodium chloride 0.9 % 100 mL IVPB 3 g 200 mL/hr   11/10/16 1246 Given   Ampicillin-Sulbactam (UNASYN) 3 g in sodium chloride 0.9 % 100 mL IVPB 3 g 200 mL/hr      MEDICATIONS: . ampicillin-sulbactam (UNASYN) IV  3 g Intravenous Q6H  . Chlorhexidine Gluconate Cloth  6 each Topical Q0600  . dexamethasone  10 mg Intravenous Q8H  . escitalopram  10 mg Oral Daily  . ipratropium-albuterol  3 mL Nebulization Q6H  . levothyroxine  50 mcg Oral QAC breakfast  . mouth rinse  15 mL Mouth Rinse BID  . mupirocin ointment  1 application Nasal BID  . nystatin cream  1 application Topical BID  . [START ON 11/11/2016] pneumococcal 23 valent vaccine  0.5 mL Intramuscular Tomorrow-1000  . QUEtiapine  150 mg Oral QHS    Review of Systems  - unable to obtain  OBJECTIVE: Temp:  [97.4 F (36.3 C)-97.6 F (36.4 C)] 97.4 F (36.3 C) (12/15 0415) Pulse Rate:  [58-97] 90 (12/15 0415) Resp:  [20] 20 (12/15 0415) BP: (95-130)/(47-82) 115/65 (12/15 0415) SpO2:  [94 %-100 %] 94 % (12/15 1333) Weight:  [49.7 kg (109 lb 8 oz)-50.9 kg (112 lb 2 oz)] 50.9 kg (112 lb 2 oz) (12/14 1946) Physical Exam  Constitutional: frail, lying in bed, eyes closed saying I just want to sleep    HENT:anicteric Mouth/Throat: Oropharynx is clear but very dry R sub Mand region with firm warm, mildly tender swelling Cardiovascular: Normal rate, regular rhythm and normal heart sounds.  Distnat PPM site wnl Pulmonary/Chest: Effort normal and breath sounds normal. No respiratory distress.  has no wheezes.  Neck = supple, no nuchal rigidity Abdominal: Soft. Bowel sounds are normal.  exhibits no distension. There is no tenderness.  Lymphadenopathy: no cervical adenopathy. No axillary adenopathy Neurological: calling out that she just wants to sleep Skin: Skin is warm and dry. No rash noted. No erythema.  Psychiatric: a normal mood and affect.  behavior is normal.    LABS: Results for orders placed or performed during the hospital encounter of 11/09/16 (from the past 48 hour(s))  CBC with Differential     Status: Abnormal   Collection Time: 11/09/16  3:49 PM  Result Value Ref Range   WBC 38.5 (H) 3.6 - 11.0 K/uL   RBC 3.52 (L) 3.80 - 5.20 MIL/uL   Hemoglobin 8.8 (L) 12.0 - 16.0 g/dL   HCT 28.3 (L) 35.0 - 47.0 %   MCV 80.3 80.0 - 100.0 fL   MCH 25.0 (L) 26.0 - 34.0 pg   MCHC 31.2 (L) 32.0 - 36.0 g/dL   RDW 17.2 (H) 11.5 - 14.5 %   Platelets 291 150 - 440 K/uL   Neutrophils Relative % 95 %   Neutro Abs 36.5 (H) 1.4 - 6.5 K/uL   Lymphocytes Relative 2 %   Lymphs Abs 0.6 (L) 1.0 - 3.6 K/uL   Monocytes Relative 2 %   Monocytes Absolute 0.9 0.2 - 0.9 K/uL   Eosinophils Relative 0 %   Eosinophils Absolute 0.0 0 - 0.7 K/uL   Basophils Relative 1 %    Basophils Absolute 0.4 (H) 0 - 0.1 K/uL  Comprehensive metabolic panel     Status: Abnormal   Collection Time: 11/09/16  3:49 PM  Result Value Ref Range   Sodium 138 135 - 145 mmol/L   Potassium 3.5 3.5 - 5.1 mmol/L   Chloride 110 101 - 111 mmol/L   CO2 20 (L) 22 - 32 mmol/L   Glucose, Bld 105 (H) 65 - 99 mg/dL   BUN 30 (H) 6 - 20 mg/dL   Creatinine, Ser 0.91 0.44 - 1.00 mg/dL   Calcium 8.8 (L) 8.9 - 10.3 mg/dL   Total Protein 6.8 6.5 - 8.1 g/dL   Albumin 2.7 (L) 3.5 - 5.0 g/dL   AST 25 15 - 41 U/L   ALT 10 (L) 14 - 54 U/L   Alkaline Phosphatase 102 38 - 126 U/L   Total Bilirubin 0.9 0.3 - 1.2 mg/dL   GFR calc non Af Amer 52 (L) >60 mL/min   GFR calc Af Amer 60 (L) >60 mL/min    Comment: (NOTE) The eGFR has been calculated using the CKD EPI equation. This calculation has not been validated in all clinical situations. eGFR's persistently <60 mL/min signify possible Chronic Kidney Disease.    Anion gap 8 5 - 15  CULTURE, BLOOD (ROUTINE X 2) w Reflex to ID Panel     Status: None (Preliminary result)   Collection Time: 11/09/16  5:55 PM  Result Value Ref Range   Specimen Description BLOOD L AC    Special Requests      BOTTLES DRAWN AEROBIC AND ANAEROBIC ANA 16 AER 13ML   Culture  Setup Time      GRAM POSITIVE COCCI IN BOTH AEROBIC AND ANAEROBIC BOTTLES CRITICAL RESULT CALLED TO, READ BACK BY AND VERIFIED WITH: Hank Zompa @ 1046 11/10/16 by [place]    Culture [place]    Report Status PENDING   Blood Culture ID Panel (Reflexed)     Status: Abnormal   Collection Time: 11/09/16  5:55 PM  Result Value Ref Range   Enterococcus species DETECTED (A) NOT DETECTED    Comment: CRITICAL RESULT CALLED TO, READ BACK BY AND VERIFIED WITH: Hank Zompa @ 5056 11/10/16 by [place]    Vancomycin resistance NOT DETECTED NOT DETECTED   Listeria monocytogenes NOT DETECTED NOT DETECTED   Staphylococcus species NOT DETECTED NOT DETECTED   Staphylococcus aureus NOT DETECTED NOT DETECTED    Streptococcus species NOT DETECTED NOT DETECTED   Streptococcus agalactiae NOT DETECTED NOT DETECTED   Streptococcus pneumoniae NOT DETECTED NOT DETECTED   Streptococcus pyogenes NOT DETECTED  NOT DETECTED   Acinetobacter baumannii NOT DETECTED NOT DETECTED   Enterobacteriaceae species NOT DETECTED NOT DETECTED   Enterobacter cloacae complex NOT DETECTED NOT DETECTED   Escherichia coli NOT DETECTED NOT DETECTED   Klebsiella oxytoca NOT DETECTED NOT DETECTED   Klebsiella pneumoniae NOT DETECTED NOT DETECTED   Proteus species NOT DETECTED NOT DETECTED   Serratia marcescens NOT DETECTED NOT DETECTED   Haemophilus influenzae NOT DETECTED NOT DETECTED   Neisseria meningitidis NOT DETECTED NOT DETECTED   Pseudomonas aeruginosa NOT DETECTED NOT DETECTED   Candida albicans NOT DETECTED NOT DETECTED   Candida glabrata NOT DETECTED NOT DETECTED   Candida krusei NOT DETECTED NOT DETECTED   Candida parapsilosis NOT DETECTED NOT DETECTED   Candida tropicalis NOT DETECTED NOT DETECTED  CULTURE, BLOOD (ROUTINE X 2) w Reflex to ID Panel     Status: None (Preliminary result)   Collection Time: 11/09/16  6:00 PM  Result Value Ref Range   Specimen Description BLOOD  R HAND    Special Requests      BOTTLES DRAWN AEROBIC AND ANAEROBIC  ANA 3ML AER 5ML   Culture  Setup Time      GRAM POSITIVE COCCI AEROBIC BOTTLE ONLY CRITICAL VALUE NOTED.  VALUE IS CONSISTENT WITH PREVIOUSLY REPORTED AND CALLED VALUE.    Culture GRAM POSITIVE COCCI    Report Status PENDING   Procalcitonin - Baseline     Status: None   Collection Time: 11/09/16  6:27 PM  Result Value Ref Range   Procalcitonin 0.72 ng/mL    Comment:        Interpretation: PCT > 0.5 ng/mL and <= 2 ng/mL: Systemic infection (sepsis) is possible, but other conditions are known to elevate PCT as well. (NOTE)         ICU PCT Algorithm               Non ICU PCT Algorithm    ----------------------------     ------------------------------         PCT <  0.25 ng/mL                 PCT < 0.1 ng/mL     Stopping of antibiotics            Stopping of antibiotics       strongly encouraged.               strongly encouraged.    ----------------------------     ------------------------------       PCT level decrease by               PCT < 0.25 ng/mL       >= 80% from peak PCT       OR PCT 0.25 - 0.5 ng/mL          Stopping of antibiotics                                             encouraged.     Stopping of antibiotics           encouraged.    ----------------------------     ------------------------------       PCT level decrease by              PCT >= 0.25 ng/mL       < 80% from peak PCT  AND PCT >= 0.5 ng/mL             Continuing antibiotics                                              encouraged.       Continuing antibiotics            encouraged.    ----------------------------     ------------------------------     PCT level increase compared          PCT > 0.5 ng/mL         with peak PCT AND          PCT >= 0.5 ng/mL             Escalation of antibiotics                                          strongly encouraged.      Escalation of antibiotics        strongly encouraged.   Lactic acid, plasma     Status: None   Collection Time: 11/09/16  6:27 PM  Result Value Ref Range   Lactic Acid, Venous 1.4 0.5 - 1.9 mmol/L  Basic metabolic panel     Status: Abnormal   Collection Time: 11/10/16  4:47 AM  Result Value Ref Range   Sodium 140 135 - 145 mmol/L   Potassium 4.0 3.5 - 5.1 mmol/L   Chloride 113 (H) 101 - 111 mmol/L   CO2 19 (L) 22 - 32 mmol/L   Glucose, Bld 131 (H) 65 - 99 mg/dL   BUN 27 (H) 6 - 20 mg/dL   Creatinine, Ser 0.80 0.44 - 1.00 mg/dL   Calcium 8.3 (L) 8.9 - 10.3 mg/dL   GFR calc non Af Amer >60 >60 mL/min   GFR calc Af Amer >60 >60 mL/min    Comment: (NOTE) The eGFR has been calculated using the CKD EPI equation. This calculation has not been validated in all clinical situations. eGFR's persistently <60  mL/min signify possible Chronic Kidney Disease.    Anion gap 8 5 - 15  CBC     Status: Abnormal   Collection Time: 11/10/16  4:47 AM  Result Value Ref Range   WBC 22.1 (H) 3.6 - 11.0 K/uL   RBC 3.33 (L) 3.80 - 5.20 MIL/uL   Hemoglobin 8.4 (L) 12.0 - 16.0 g/dL   HCT 26.7 (L) 35.0 - 47.0 %   MCV 80.2 80.0 - 100.0 fL   MCH 25.3 (L) 26.0 - 34.0 pg   MCHC 31.6 (L) 32.0 - 36.0 g/dL   RDW 17.5 (H) 11.5 - 14.5 %   Platelets 278 150 - 440 K/uL  MRSA PCR Screening     Status: Abnormal   Collection Time: 11/10/16  7:34 AM  Result Value Ref Range   MRSA by PCR POSITIVE (A) NEGATIVE    Comment:        The GeneXpert MRSA Assay (FDA approved for NASAL specimens only), is one component of a comprehensive MRSA colonization surveillance program. It is not intended to diagnose MRSA infection nor to guide or monitor treatment for MRSA infections. RESULT CALLED TO, READ BACK BY AND VERIFIED WITH: Andre Lefort @ [phone] 11/10/16 by  [place]    No components found for: ESR, C REACTIVE PROTEIN MICRO: Recent Results (from the past 720 hour(s))  CULTURE, BLOOD (ROUTINE X 2) w Reflex to ID Panel     Status: None (Preliminary result)   Collection Time: 11/09/16  5:55 PM  Result Value Ref Range Status   Specimen Description BLOOD L AC  Final   Special Requests   Final    BOTTLES DRAWN AEROBIC AND ANAEROBIC ANA 16 AER 13ML   Culture  Setup Time   Final    GRAM POSITIVE COCCI IN BOTH AEROBIC AND ANAEROBIC BOTTLES CRITICAL RESULT CALLED TO, READ BACK BY AND VERIFIED WITH: Hank Zompa @ 1046 11/10/16 by [facility]    Culture [place]  Final   Report Status PENDING  Incomplete  Blood Culture ID Panel (Reflexed)     Status: Abnormal   Collection Time: 11/09/16  5:55 PM  Result Value Ref Range Status   Enterococcus species DETECTED (A) NOT DETECTED Final    Comment: CRITICAL RESULT CALLED TO, READ BACK BY AND VERIFIED WITH: Hank Zompa @ 1046 11/10/16 by [place]    Vancomycin resistance NOT DETECTED NOT  DETECTED Final   Listeria monocytogenes NOT DETECTED NOT DETECTED Final   Staphylococcus species NOT DETECTED NOT DETECTED Final   Staphylococcus aureus NOT DETECTED NOT DETECTED Final   Streptococcus species NOT DETECTED NOT DETECTED Final   Streptococcus agalactiae NOT DETECTED NOT DETECTED Final   Streptococcus pneumoniae NOT DETECTED NOT DETECTED Final   Streptococcus pyogenes NOT DETECTED NOT DETECTED Final   Acinetobacter baumannii NOT DETECTED NOT DETECTED Final   Enterobacteriaceae species NOT DETECTED NOT DETECTED Final   Enterobacter cloacae complex NOT DETECTED NOT DETECTED Final   Escherichia coli NOT DETECTED NOT DETECTED Final   Klebsiella oxytoca NOT DETECTED NOT DETECTED Final   Klebsiella pneumoniae NOT DETECTED NOT DETECTED Final   Proteus species NOT DETECTED NOT DETECTED Final   Serratia marcescens NOT DETECTED NOT DETECTED Final   Haemophilus influenzae NOT DETECTED NOT DETECTED Final   Neisseria meningitidis NOT DETECTED NOT DETECTED Final   Pseudomonas aeruginosa NOT DETECTED NOT DETECTED Final   Candida albicans NOT DETECTED NOT DETECTED Final   Candida glabrata NOT DETECTED NOT DETECTED Final   Candida krusei NOT DETECTED NOT DETECTED Final   Candida parapsilosis NOT DETECTED NOT DETECTED Final   Candida tropicalis NOT DETECTED NOT DETECTED Final  CULTURE, BLOOD (ROUTINE X 2) w Reflex to ID Panel     Status: None (Preliminary result)   Collection Time: 11/09/16  6:00 PM  Result Value Ref Range Status   Specimen Description BLOOD  R HAND  Final   Special Requests   Final    BOTTLES DRAWN AEROBIC AND ANAEROBIC  ANA 3ML AER 5ML   Culture  Setup Time   Final    GRAM POSITIVE COCCI AEROBIC BOTTLE ONLY CRITICAL VALUE NOTED.  VALUE IS CONSISTENT WITH PREVIOUSLY REPORTED AND CALLED VALUE.    Culture GRAM POSITIVE COCCI  Final   Report Status PENDING  Incomplete  MRSA PCR Screening     Status: Abnormal   Collection Time: 11/10/16  7:34 AM  Result Value Ref Range  Status   MRSA by PCR POSITIVE (A) NEGATIVE Final    Comment:        The GeneXpert MRSA Assay (FDA approved for NASAL specimens only), is one component of a comprehensive MRSA colonization surveillance program. It is not intended to diagnose MRSA infection nor to guide or monitor treatment for  MRSA infections. RESULT CALLED TO, READ BACK BY AND VERIFIED WITH: Andre Lefort @ 1740 11/10/16 by [facility]     IMAGING: Ct Soft Tissue Neck W Contrast  Result Date: 11/09/2016 CLINICAL DATA:  Right neck swelling. Sore throat and altered mental status. EXAM: CT NECK WITH CONTRAST TECHNIQUE: Multidetector CT imaging of the neck was performed using the standard protocol following the bolus administration of intravenous contrast. CONTRAST:  89m ISOVUE-300 IOPAMIDOL (ISOVUE-300) INJECTION 61% COMPARISON:  None. FINDINGS: Pharynx and larynx: Submucosal edema in the lower pharynx and supraglottic larynx. Mild mass effect on the right lateral pharynx by the right submandibular space process. Right tonsillar calcification. Salivary glands: There is extensive heterogeneous soft tissue throughout the right submandibular space with both hypoenhancing and hyperenhancing material suggestive of phlegmon. The right submandibular gland is not discretely identified. Inflammatory stranding extends into the subcutaneous soft tissues of the face and upper neck. Stranding also extends posteriorly into the carotid space. The airway remains patent. No discrete, well-defined drainable fluid collection is identified. The left submandibular gland and right parotid gland are unremarkable. There is an 11 mm peripherally enhancing nodule in the superficial left parotid gland. A similar but smaller nodule measures 4 mm more inferiorly in the left parotid. The left parotid gland is slightly enlarged compared to the right. Thyroid: Unremarkable. Lymph nodes: Small but asymmetric right submandibular lymph nodes measure up to 5 mm in short  axis. Small right level II lymph nodes measure up to 6 mm in short axis, and there are asymmetric right level III and IV lymph nodes which are sub 5 mm. These are all most likely reactive. Vascular: Major vascular structures of the neck appear patent. Extensive calcified plaque is present at both carotid bifurcations with at least moderate proximal ICA stenosis on the right and mild-to-moderate stenosis on the left. Limited intracranial: Partially visualized cerebral atrophy. Visualized orbits: Unremarkable. Mastoids and visualized paranasal sinuses: Clear. Skeleton: Moderately advanced disc and facet degeneration in the cervical spine. T2 compression fracture with 70% anterior vertebral body height loss. This is chronic in appearance, with ankylosis between the T2 and T3 vertebral bodies anteriorly and without evidence of significant paravertebral hematoma. Posterior T2 vertebral body retropulsion measures 4 mm. Upper chest: Partially visualized heterogeneous lung opacities in the posterior left upper lobe and superior segment of the left lower lobe with small volume left pleural fluid. Aortic atherosclerosis. Right subclavian approach pacemaker. Subcentimeter mediastinal lymph nodes. Other: None. IMPRESSION: 1. Extensive phlegmonous type changes in the right submandibular space which may reflect marked infectious sialadenitis. No organized abscess identified. 2. Pharyngeal and supraglottic edema.  Patent airway. 3. Small nodules in the left parotid gland, the larger measuring 11 mm. This may reflect a primary parotid neoplasm, abnormal lymph node, or potentially focal inflammatory change in the setting of mild acute parotitis given mild asymmetric enlargement of the left parotid gland and edema extending into the left face. 4. Partially visualized lung opacities in the left upper and left lower lobes suspicious for pneumonia. Chest radiographs recommended. 5. Partially visualized left pleural effusion. 6. Aortic  atherosclerosis. Carotid bifurcation atherosclerosis with right greater than left proximal ICA stenosis. 7. Moderate T2 compression fracture, chronic in appearance. Electronically Signed   By: ALogan BoresM.D.   On: 11/09/2016 17:34    Assessment:   Christine [name] a 82y.o. female with dementia admitted with leukocytosis cough possible aspiration pneumonia and sialoadenitis who also has enterococcal bacteremia.  She does have a permanent pacemaker in place which  in the setting of bacteremia may have been seeded.   White count has improved with IV antibiotics.  Recommendations She should remain on Unasyn for now I have ordered an echocardiogram.  There is relatively high risk that she may have seeded her pacemaker wires. Ordered repeat blood cultures. If her follow-up blood cultures are positive or there is evidence of endocarditis or infection in her pacemaker wires this would have to be removed.  However given her advanced age and comorbidities am not sure that is a very good option.  For now will plan on about 2 weeks of abx but could potentially be oral.Will follow.   Thank you very much for allowing me to participate in the care of this patient. Please call with questions.   Cheral Marker. Ola Spurr, MD

## 2016-11-10 NOTE — [place] FL2 (Signed)
[place] MEDICAID FL2 LEVEL OF CARE SCREENING TOOL     IDENTIFICATION  Patient Name: Christine [name] Birthdate: December 11, 1917 Sex: female Admission Date (Current Location): 11/09/2016  [facility] [place] [place]  [place]  ([id number]) Facility and Address:  [facility], [address], [place], [place] [address]      Provider Number: [id number]  Attending Physician Name and Address:  Katharina Caperima [place], MD  Relative Name and Phone Number:       Current Level of Care: Hospital Recommended Level of Care: Skilled Nursing Facility Prior Approval Number:    Date Approved/Denied:   PASRR Number:  ( [id number] )  Discharge Plan: SNF    Current Diagnoses: Patient Active Problem List   Diagnosis Date Noted  . Sepsis (H[facility]) 11/09/2016    Orientation RESPIRATION BLADDER Height & Weight      (Disoriented X4 )  Normal Incontinent Weight: 112 lb 2 oz (50.9 kg) Height:  5\' 3"  (160 cm)  BEHAVIORAL SYMPTOMS/MOOD NEUROLOGICAL BOWEL NUTRITION STATUS   (none)  (none) Continent Diet (NPO SLP to evaluate ( Please see D/C Summary for updated diet) )  AMBULATORY STATUS COMMUNICATION OF NEEDS Skin   Extensive Assist Verbally Normal                       Personal Care Assistance Level of Assistance  Bathing, Feeding, Dressing Bathing Assistance: Limited assistance Feeding assistance: Independent Dressing Assistance: Limited assistance     Functional Limitations Info  Sight, Hearing, Speech Sight Info: Adequate Hearing Info: Adequate Speech Info: Adequate    SPECIAL CARE FACTORS FREQUENCY                       Contractures      Additional Factors Info  Code Status, Allergies Code Status Info:  (DNR ) Allergies Info:  (No Known Allergies. )           Current Medications (11/10/2016):  This is the current hospital active medication list Current Facility-Administered Medications  Medication Dose Route Frequency Provider Last Rate Last  Dose  . 0.9 % NaCl with KCl 20 mEq/ L  infusion   Intravenous Continuous Alford Highlandichard Wieting, MD 50 mL/hr at 11/09/16 2003    . acetaminophen (TYLENOL) tablet 650 mg  650 mg Oral Q6H PRN Alford Highlandichard Wieting, MD       Or  . acetaminophen (TYLENOL) suppository 650 mg  650 mg Rectal Q6H PRN Alford Highlandichard Wieting, MD      . Ampicillin-Sulbactam (UNASYN) 3 g in sodium chloride 0.9 % 100 mL IVPB  3 g Intravenous Q6H Valentina GuScott D Christy, RPH      . dexamethasone (DECADRON) injection 10 mg  10 mg Intravenous Q8H Alford Highlandichard Wieting, MD   10 mg at 11/09/16 2339  . escitalopram (LEXAPRO) tablet 10 mg  10 mg Oral Daily Alford Highlandichard Wieting, MD      . ipratropium-albuterol (DUONEB) 0.5-2.5 (3) MG/3ML nebulizer solution 3 mL  3 mL Nebulization Q6H Alford Highlandichard Wieting, MD   3 mL at 11/10/16 0726  . levothyroxine (SYNTHROID, LEVOTHROID) tablet 50 mcg  50 mcg Oral QAC breakfast Alford Highlandichard Wieting, MD      . MEDLINE mouth rinse  15 mL Mouth Rinse BID Alford Highlandichard Wieting, MD      . nystatin cream (MYCOSTATIN) 1 application  1 application Topical BID Alford Highlandichard Wieting, MD   1 application at 11/09/16 2248  . [START ON 11/11/2016] pneumococcal 23 valent vaccine (PNU-IMMUNE) injection 0.5 mL  0.5 mL  Intramuscular Tomorrow-1000 Alford Highlandichard Wieting, MD      . QUEtiapine (SEROQUEL) tablet 150 mg  150 mg Oral QHS Alford Highlandichard Wieting, MD         Discharge Medications: Please see discharge summary for a list of discharge medications.  Relevant Imaging Results:  Relevant Lab Results:   Additional Information  (SSN: [id number])  Sample, Darleen CrockerBailey M, LCSW

## 2016-11-10 NOTE — Clinical Social Work Note (Signed)
Clinical Social Work Assessment  Patient Details  Name: Christine [name] MRN: [id number] Date of Birth: 09/22/1918  Date of referral:  11/10/16               Reason for consult:  Other (Comment Required) (From [name] )                Permission sought to share information with:  Oceanographeracility Contact Representative Permission granted to share information::  Yes, Verbal Permission Granted  Name::      [name]   Agency::   Skilled Nursing Facility   Relationship::     Contact Information:     Housing/Transportation Living arrangements for the past 2 months:  Skilled Nursing Facility Source of Information:  Patient, Adult Children Patient Interpreter Needed:  None Criminal Activity/Legal Involvement Pertinent to Current Situation/Hospitalization:  No - Comment as needed Significant Relationships:  Adult Children Lives with:  Facility Resident Do you feel safe going back to the place where you live?    Need for family participation in patient care:  Yes (Comment)  Care giving concerns:  Patient is a long term care resident at [name].    Social Worker assessment / plan:  Visual merchandiserClinical Social Worker (CSW) received consult that patient is from [name]. Per Christine [name] patient is a long term care resident and can return when stable. Per chart patient is not alert and oriented. CSW contacted patient's daughter/ HPOA Christine [name]. Per daughter she is patient's only child and HPOA. Per daughter she [place] in [place], [place] and and flew to [place] a [place] days and [place]. Daughter reported that patient has been at [name] for over 1 year now. Per daughter patient worked [place] lived in [place] until she was in her 2080's. Daughter spoke fondly of patient and stated that she regrets not being in town right now. CSW provided emotional support. Daughter is agreeable for patient to return to [name] when stable. CSW will continue to follow and assist as needed.    Employment status:   Retired Health and safety inspectornsurance information:  Armed forces operational officerMedicare, [place] Recommendations:  Not assessed at this time Information / Referral to community resources:  Skilled Nursing Facility  Patient/Family's Response to care:  Patient's daughter is agreeable for patient to return to [name].   Patient/Family's Understanding of and Emotional Response to Diagnosis, Current Treatment, and Prognosis:  Patient's daughter was very pleasant and thanked CSW for calling.   Emotional Assessment Appearance:  Appears stated age Attitude/Demeanor/Rapport:  Unable to Assess Affect (typically observed):  Unable to Assess Orientation:  Oriented to Self, Fluctuating Orientation (Suspected and/or reported Sundowners) Alcohol / Substance use:  Not Applicable Psych involvement (Current and /or in the community):  No (Comment)  Discharge Needs  Concerns to be addressed:  Discharge Planning Concerns Readmission within the last 30 days:  No Current discharge risk:  Dependent with Mobility Barriers to Discharge:  Continued Medical Work up   [employer], Christine CrockerBailey M, LCSW 11/10/2016, 3:28 PM

## 2016-11-10 NOTE — Evaluation (Signed)
Clinical/Bedside Swallow Evaluation Patient Details  Name: Christine [name] MRN: [id number] Date of Birth: 1918-08-23  Today's Date: 11/10/2016 Time: SLP Start Time (ACUTE ONLY): 1015 SLP Stop Time (ACUTE ONLY): 1100 SLP Time Calculation (min) (ACUTE ONLY): 45 min  Past Medical History:  Past Medical History:  Diagnosis Date  . Anemia   . Anxiety   . Atrioventricular block, complete (HCC)   . GERD (gastroesophageal reflux disease)   . Hypertension   . Thyroid disease   . Unspecified dementia with behavioral disturbance    Past Surgical History:  Past Surgical History:  Procedure Laterality Date  . CARDIAC PACEMAKER PLACEMENT     HPI:  Pt is a 82 y.o. female with a known history of Dementia, GERD, HTN, pacemaker. Patient unable to give any history at this time. [place] in the [place]. On Tuesday she started getting hoarse. On Wednesday wheeze and a cough. Chest x-ray showed pneumonitis and was started on a Z-Pak and breathing treatments. This a.m. they noticed some swelling and edema around her neck and she is spitting up cottage cheese-type material. They sent her to ENT Dr. Gregary [name]'s office and she was sent into the ER for admission. In the ER blood pressure was on the lower side, white count elevated and tachycardic and hospitalist services were contacted for admission area normally she is able to communicate but is total care as per nurse at the facility. Unsure of pt's level of need for ADLs. Pt frequently calling in an agitated manner and could not be calmed easily. SHe did not follow instructions; did not give her name.    Assessment / Plan / Recommendation Clinical Impression  Pt appears at increased risk for aspiration secondary to her declined Cognitive status; pt frequently calling out "I need some sleep". She could not be calmed easily. Pt positioned upright and presented w/ TSPs of the puree and sips of liquid via straw. Unsure if pt was going to accept  the po trials d/t her Cognitive status, but put to her lips, she opened mouth minimally and took small amounts. During oral phase, she smacked and cleared given time; no overt s/s of aspiration noted w/ the few trials. Pt did not allow for further po trials d/t agitation. Recommend a Dysphagia level 1 diet w/ thin liquids w/ strict aspiration precautions; feeding assistance. Recommend meds in Puree - crushed as needed for easier intake/swallowing. Monitor for toleration of thin liquids and adjust diet as indicated.     Aspiration Risk  Mild aspiration risk    Diet Recommendation  Dysphagia level 1 w/ Thin liquids; aspiration precautions; feeding support at meals.  Medication Administration: Whole meds with puree    Other  Recommendations Recommended Consults:  (Dietician) Oral Care Recommendations: Oral care BID;Staff/trained caregiver to provide oral care   Follow up Recommendations  (TBD)      Frequency and Duration min 3x week  2 weeks       Prognosis Prognosis for Safe Diet Advancement: Fair Barriers to Reach Goals: Cognitive deficits;Severity of deficits      Swallow Study   General Date of Onset: 11/09/16 HPI: Pt is a 82 y.o. female with a known history of Dementia, GERD, HTN, pacemaker. Patient unable to give any history at this time. [place] in the [place]. On Tuesday she started getting hoarse. On Wednesday wheeze and a cough. Chest x-ray showed pneumonitis and was started on a Z-Pak and breathing treatments. This a.m. they noticed some  swelling and edema around her neck and she is spitting up cottage cheese-type material. They sent her to ENT Dr. Gregary [name]'s office and she was sent into the ER for admission. In the ER blood pressure was on the lower side, white count elevated and tachycardic and hospitalist services were contacted for admission area normally she is able to communicate but is total care as per nurse at the facility. Unsure of pt's level of need  for ADLs. Pt frequently calling in an agitated manner and could not be calmed easily. SHe did not follow instructions; did not give her name.  Type of Study: Bedside Swallow Evaluation Previous Swallow Assessment: none indicated Diet Prior to this Study:  (unknown) Temperature Spikes Noted: No (wbc 22.1) Respiratory Status: Room air History of Recent Intubation: No Behavior/Cognition: Confused;Agitated;Lethargic/Drowsy;Requires cueing;Doesn't follow directions (awakened) Oral Cavity Assessment:  (could not assess d/t Cognitive status) Oral Care Completed by SLP: Recent completion by staff Oral Cavity - Dentition: Edentulous (? Has dentures at the NH per report) Vision:  (n/a) Self-Feeding Abilities: Total assist Patient Positioning: Upright in bed Baseline Vocal Quality: Normal (when she spoke) Volitional Cough: Cognitively unable to elicit Volitional Swallow: Unable to elicit    Oral/Motor/Sensory Function Overall Oral Motor/Sensory Function:  (could not assess d/t Cognitive status; wfl w/ boluses)   Ice Chips Ice chips: Not tested   Thin Liquid Thin Liquid: Impaired Presentation: Straw (fed; 4 trials taken) Oral Phase Impairments: Poor awareness of bolus (and reduced awareness of taking po's ) Oral Phase Functional Implications:  (none) Pharyngeal  Phase Impairments:  (none)    Nectar Thick Nectar Thick Liquid: Not tested   Honey Thick Honey Thick Liquid: Not tested   Puree Puree: Impaired Presentation: Spoon (fed; 3 trials(1/4-1/2 tsp)) Oral Phase Impairments: Poor awareness of bolus (lingual bunching to not allow much food in her mouth) Oral Phase Functional Implications:  (smacking to clear) Pharyngeal Phase Impairments:  (none)   Solid   GO   Solid: Not tested Other Comments: could not assess d/t Cognitive status         Christine SomKatherine Apostolos Blagg, MS, CCC-SLP Christine [name] 11/10/2016,2:36 PM

## 2016-11-10 NOTE — Progress Notes (Addendum)
[facility] [place] [facility]   PATIENT NAME: Christine [name]    MR#:  [id number]  DATE OF BIRTH:  Jul 02, 1918  SUBJECTIVE:  CHIEF COMPLAINT:   Chief Complaint  Patient presents with  . Neck Pain  Patient is a 82 year old Caucasian female with past medical history significant for history of anemia, anxiety, complete AV block, gastroesophageal reflux disease, hypertension, dementia, who presents to the hospital with complaints of hoarseness, cough and wheezing, she was given a Z-Pak and breathing treatments in the facility, however, she was noted to have swelling and edema around the neck area, throat area and she was sent to ENT, from where she was brought to emergency room for further evaluation and treatment. Patient herself is not able to provide any history due to dementia, she cries for water. Speech therapist evaluation is pending. Chest x-ray revealed aspiration pneumonitis, CT of neck revealed right submandibular sialadenitis, phlegmon, small nodule versus mass in left left parotid gland, concerning for mass  ROS  VITAL SIGNS: Blood pressure 115/65, pulse 90, temperature 97.4 F (36.3 C), temperature source Oral, resp. rate 20, height 5\' 3"  (1.6 m), weight 50.9 kg (112 lb 2 oz), SpO2 94 %.  PHYSICAL EXAMINATION:   GENERAL:  82 y.o.-year-old patient lying in the bed in the mild to moderate distress, restless, uncomfortable, shouting for drinking water.  EYES: Pupils equal, round, reactive to light and accommodation. No scleral icterus. Extraocular muscles intact.  HEENT: Head atraumatic, normocephalic. Oropharynx and nasopharynx clear. Neck reveals tightness, swelling, pain in right submandibular area and some in left parotid glands, no fluctuations NECK:  Supple, no jugular venous distention. No thyroid enlargement, no tenderness.  LUNGS: some diminished breath sounds bilaterally, no wheezing, few scattered rales,rhonchi or crepitations bilaterally. No  use of accessory muscles of respiration.  CARDIOVASCULAR: S1, S2 normal. No murmurs, rubs, or gallops.  ABDOMEN: Soft, nontender, nondistended. Bowel sounds present. No organomegaly or mass.  EXTREMITIES: No pedal edema, cyanosis, or clubbing.  NEUROLOGIC: Cranial nerves II through XII are intact. Muscle strength 5/5 in all extremities. Sensation intact. Gait not checked.  PSYCHIATRIC: The patient is alert, agitated, not oriented, although was able to answer a few questions appropriately SKIN: No obvious rash, lesion, or ulcer.   ORDERS/RESULTS REVIEWED:   CBC  Recent Labs Lab 11/09/16 1549 11/10/16 0447  WBC 38.5* 22.1*  HGB 8.8* 8.4*  HCT 28.3* 26.7*  PLT 291 278  MCV 80.3 80.2  MCH 25.0* 25.3*  MCHC 31.2* 31.6*  RDW 17.2* 17.5*  LYMPHSABS 0.6*  --   MONOABS 0.9  --   EOSABS 0.0  --   BASOSABS 0.4*  --    ------------------------------------------------------------------------------------------------------------------  Chemistries   Recent Labs Lab 11/09/16 1549 11/10/16 0447  NA 138 140  K 3.5 4.0  CL 110 113*  CO2 20* 19*  GLUCOSE 105* 131*  BUN 30* 27*  CREATININE 0.91 0.80  CALCIUM 8.8* 8.3*  AST 25  --   ALT 10*  --   ALKPHOS 102  --   BILITOT 0.9  --    ------------------------------------------------------------------------------------------------------------------ estimated creatinine clearance is 33 mL/min (by C-G formula based on SCr of 0.8 mg/dL). ------------------------------------------------------------------------------------------------------------------ No results for input(s): TSH, T4TOTAL, T3FREE, THYROIDAB in the last 72 hours.  Invalid input(s): FREET3  Cardiac Enzymes No results for input(s): CKMB, TROPONINI, MYOGLOBIN in the last 168 hours.  Invalid input(s): CK ------------------------------------------------------------------------------------------------------------------ Invalid input(s):  POCBNP ---------------------------------------------------------------------------------------------------------------  RADIOLOGY: Ct Soft Tissue Neck W Contrast  Result Date: 11/09/2016  CLINICAL DATA:  Right neck swelling. Sore throat and altered mental status. EXAM: CT NECK WITH CONTRAST TECHNIQUE: Multidetector CT imaging of the neck was performed using the standard protocol following the bolus administration of intravenous contrast. CONTRAST:  75mL ISOVUE-300 IOPAMIDOL (ISOVUE-300) INJECTION 61% COMPARISON:  None. FINDINGS: Pharynx and larynx: Submucosal edema in the lower pharynx and supraglottic larynx. Mild mass effect on the right lateral pharynx by the right submandibular space process. Right tonsillar calcification. Salivary glands: There is extensive heterogeneous soft tissue throughout the right submandibular space with both hypoenhancing and hyperenhancing material suggestive of phlegmon. The right submandibular gland is not discretely identified. Inflammatory stranding extends into the subcutaneous soft tissues of the face and upper neck. Stranding also extends posteriorly into the carotid space. The airway remains patent. No discrete, well-defined drainable fluid collection is identified. The left submandibular gland and right parotid gland are unremarkable. There is an 11 mm peripherally enhancing nodule in the superficial left parotid gland. A similar but smaller nodule measures 4 mm more inferiorly in the left parotid. The left parotid gland is slightly enlarged compared to the right. Thyroid: Unremarkable. Lymph nodes: Small but asymmetric right submandibular lymph nodes measure up to 5 mm in short axis. Small right level II lymph nodes measure up to 6 mm in short axis, and there are asymmetric right level III and IV lymph nodes which are sub 5 mm. These are all most likely reactive. Vascular: Major vascular structures of the neck appear patent. Extensive calcified plaque is present at both  carotid bifurcations with at least moderate proximal ICA stenosis on the right and mild-to-moderate stenosis on the left. Limited intracranial: Partially visualized cerebral atrophy. Visualized orbits: Unremarkable. Mastoids and visualized paranasal sinuses: Clear. Skeleton: Moderately advanced disc and facet degeneration in the cervical spine. T2 compression fracture with 70% anterior vertebral body height loss. This is chronic in appearance, with ankylosis between the T2 and T3 vertebral bodies anteriorly and without evidence of significant paravertebral hematoma. Posterior T2 vertebral body retropulsion measures 4 mm. Upper chest: Partially visualized heterogeneous lung opacities in the posterior left upper lobe and superior segment of the left lower lobe with small volume left pleural fluid. Aortic atherosclerosis. Right subclavian approach pacemaker. Subcentimeter mediastinal lymph nodes. Other: None. IMPRESSION: 1. Extensive phlegmonous type changes in the right submandibular space which may reflect marked infectious sialadenitis. No organized abscess identified. 2. Pharyngeal and supraglottic edema.  Patent airway. 3. Small nodules in the left parotid gland, the larger measuring 11 mm. This may reflect a primary parotid neoplasm, abnormal lymph node, or potentially focal inflammatory change in the setting of mild acute parotitis given mild asymmetric enlargement of the left parotid gland and edema extending into the left face. 4. Partially visualized lung opacities in the left upper and left lower lobes suspicious for pneumonia. Chest radiographs recommended. 5. Partially visualized left pleural effusion. 6. Aortic atherosclerosis. Carotid bifurcation atherosclerosis with right greater than left proximal ICA stenosis. 7. Moderate T2 compression fracture, chronic in appearance. Electronically Signed   By: Sebastian Ache M.D.   On: 11/09/2016 17:34    EKG: No orders found for this or any previous  visit.  ASSESSMENT AND PLAN:  Active Problems:   Sepsis (HCC)   Protein-calorie malnutrition, severe  #1. Enterococcal sepsis, continue patient on Unasyn, following culture results. The patient was seen by Dr. Sampson Goon, who recommended echocardiogram since there was high risk of infection due to pacemaker wires, repeated blood cultures are ordered #2, sialadenitis, continue antibiotic therapy, ENT  input is appreciated, no surgical intervention was recommended, continue antibiotics and steroids for swelling reduction #3. Dysphagia, patient was evaluated by speech therapist, dysphagia 1 diet as recommended #4. Aspiration pneumonitis, continue Unasyn, oxygen therapy if needed, aspiration precautions #5. Dementia with behavioral disturbance, supportive therapy, Haldol as needed #6. Hypotension, resolved on IV fluid administration, continue IV fluids at 50 cc/hour, advanced as needed #7 parotid node/mass, patient may need to have CT repeated vs MRI  after resolution of sialadenitis to rule out underlying mass  Management plans discussed with the patient, family and they are in agreement.   DRUG ALLERGIES: No Known Allergies  CODE STATUS:     Code Status Orders        Start     Ordered   11/09/16 1755  Do not attempt resuscitation (DNR)  Continuous    Question Answer Comment  In the event of cardiac or respiratory ARREST Do not call a "code blue"   In the event of cardiac or respiratory ARREST Do not perform Intubation, CPR, defibrillation or ACLS   In the event of cardiac or respiratory ARREST Use medication by any route, position, wound care, and other measures to relive pain and suffering. May use oxygen, suction and manual treatment of airway obstruction as needed for comfort.   Comments nurse may pronounce      11/09/16 1755    Code Status History    Date Active Date Inactive Code Status Order ID Comments User Context   This patient has a current code status but no historical  code status.    Advance Directive Documentation   Flowsheet Row Most Recent Value  Type of Advance Directive  Out of facility DNR (pink MOST or yellow form)  Pre-existing out of facility DNR order (yellow form or pink MOST form)  No data  "MOST" Form in Place?  No data      TIME TAKING CARE OF THIS PATIENT: 35 minutes.     Advanced care planning   Dr Sherrlyn HockVaickute POA for medical care. Mrs. Wende MottKaren Meyer   I discussed patient's medical condition, treatment plan, all questions were answered, she voiced understanding . Emotional support was provided. Power of attorney was assured, that physician will contact her if any change in patient's condition. The patient is DNR.    Time spent additional 15 minutes in discussion    Jowel Waltner M.D on 11/10/2016 at 4:32 PM  Between 7am to 6pm - Pager - [phone]  After 6pm go to [email or web address] - password [email or web address] [facility]  [facility]  Office  [phone]  CC: Primary care physician; Dorothey BasemanAVID BRONSTEIN, MD

## 2016-11-10 NOTE — Plan of Care (Signed)
Problem: SLP Dysphagia Goals Goal: Misc Dysphagia Goal Pt will safely tolerate po diet of least restrictive consistency w/ no overt s/s of aspiration noted by Staff/pt/family x3 sessions.    

## 2016-11-10 NOTE — Progress Notes (Signed)
.. 11/10/2016 11:38 AM  Shela LeffSchwartz, [place] [id number]   Temp:  [97.4 F (36.3 C)-97.6 F (36.4 C)] 97.4 F (36.3 C) (12/15 0415) Pulse Rate:  [58-97] 90 (12/15 0415) Resp:  [20] 20 (12/15 0415) BP: (95-130)/(47-82) 115/65 (12/15 0415) SpO2:  [95 %-100 %] 95 % (12/15 0726) Weight:  [49.7 kg (109 lb 8 oz)-50.9 kg (112 lb 2 oz)] 50.9 kg (112 lb 2 oz) (12/14 1946),     Intake/Output Summary (Last 24 hours) at 11/10/16 1138 Last data filed at 11/10/16 0700  Gross per 24 hour  Intake              619 ml  Output                0 ml  Net              619 ml    Results for orders placed or performed during the hospital encounter of 11/09/16 (from the past 24 hour(s))  CBC with Differential     Status: Abnormal   Collection Time: 11/09/16  3:49 PM  Result Value Ref Range   WBC 38.5 (H) 3.6 - 11.0 K/uL   RBC 3.52 (L) 3.80 - 5.20 MIL/uL   Hemoglobin 8.8 (L) 12.0 - 16.0 g/dL   HCT 84.628.3 (L) 96.235.0 - 95.247.0 %   MCV 80.3 80.0 - 100.0 fL   MCH 25.0 (L) 26.0 - 34.0 pg   MCHC 31.2 (L) 32.0 - 36.0 g/dL   RDW 84.117.2 (H) 32.411.5 - 40.114.5 %   Platelets 291 150 - 440 K/uL   Neutrophils Relative % 95 %   Neutro Abs 36.5 (H) 1.4 - 6.5 K/uL   Lymphocytes Relative 2 %   Lymphs Abs 0.6 (L) 1.0 - 3.6 K/uL   Monocytes Relative 2 %   Monocytes Absolute 0.9 0.2 - 0.9 K/uL   Eosinophils Relative 0 %   Eosinophils Absolute 0.0 0 - 0.7 K/uL   Basophils Relative 1 %   Basophils Absolute 0.4 (H) 0 - 0.1 K/uL  Comprehensive metabolic panel     Status: Abnormal   Collection Time: 11/09/16  3:49 PM  Result Value Ref Range   Sodium 138 135 - 145 mmol/L   Potassium 3.5 3.5 - 5.1 mmol/L   Chloride 110 101 - 111 mmol/L   CO2 20 (L) 22 - 32 mmol/L   Glucose, Bld 105 (H) 65 - 99 mg/dL   BUN 30 (H) 6 - 20 mg/dL   Creatinine, Ser 0.270.91 0.44 - 1.00 mg/dL   Calcium 8.8 (L) 8.9 - 10.3 mg/dL   Total Protein 6.8 6.5 - 8.1 g/dL   Albumin 2.7 (L) 3.5 - 5.0 g/dL   AST 25 15 - 41 U/L   ALT 10 (L) 14 - 54 U/L   Alkaline  Phosphatase 102 38 - 126 U/L   Total Bilirubin 0.9 0.3 - 1.2 mg/dL   GFR calc non Af Amer 52 (L) >60 mL/min   GFR calc Af Amer 60 (L) >60 mL/min   Anion gap 8 5 - 15  CULTURE, BLOOD (ROUTINE X 2) w Reflex to ID Panel     Status: None (Preliminary result)   Collection Time: 11/09/16  5:55 PM  Result Value Ref Range   Specimen Description BLOOD L AC    Special Requests      BOTTLES DRAWN AEROBIC AND ANAEROBIC ANA 16 AER 13ML   Culture  Setup Time      GRAM POSITIVE COCCI IN BOTH  AEROBIC AND ANAEROBIC BOTTLES CRITICAL RESULT CALLED TO, READ BACK BY AND VERIFIED WITH: Hank Zompa @ 1046 11/10/16 by [facility]    Culture GRAM POSITIVE COCCI    Report Status PENDING   Blood Culture ID Panel (Reflexed)     Status: Abnormal   Collection Time: 11/09/16  5:55 PM  Result Value Ref Range   Enterococcus species DETECTED (A) NOT DETECTED   Vancomycin resistance NOT DETECTED NOT DETECTED   Listeria monocytogenes NOT DETECTED NOT DETECTED   Staphylococcus species NOT DETECTED NOT DETECTED   Staphylococcus aureus NOT DETECTED NOT DETECTED   Streptococcus species NOT DETECTED NOT DETECTED   Streptococcus agalactiae NOT DETECTED NOT DETECTED   Streptococcus pneumoniae NOT DETECTED NOT DETECTED   Streptococcus pyogenes NOT DETECTED NOT DETECTED   Acinetobacter baumannii NOT DETECTED NOT DETECTED   Enterobacteriaceae species NOT DETECTED NOT DETECTED   Enterobacter cloacae complex NOT DETECTED NOT DETECTED   Escherichia coli NOT DETECTED NOT DETECTED   Klebsiella oxytoca NOT DETECTED NOT DETECTED   Klebsiella pneumoniae NOT DETECTED NOT DETECTED   Proteus species NOT DETECTED NOT DETECTED   Serratia marcescens NOT DETECTED NOT DETECTED   Haemophilus influenzae NOT DETECTED NOT DETECTED   Neisseria meningitidis NOT DETECTED NOT DETECTED   Pseudomonas aeruginosa NOT DETECTED NOT DETECTED   Candida albicans NOT DETECTED NOT DETECTED   Candida glabrata NOT DETECTED NOT DETECTED   Candida krusei NOT  DETECTED NOT DETECTED   Candida parapsilosis NOT DETECTED NOT DETECTED   Candida tropicalis NOT DETECTED NOT DETECTED  CULTURE, BLOOD (ROUTINE X 2) w Reflex to ID Panel     Status: None (Preliminary result)   Collection Time: 11/09/16  6:00 PM  Result Value Ref Range   Specimen Description BLOOD  R HAND    Special Requests      BOTTLES DRAWN AEROBIC AND ANAEROBIC  ANA 3ML AER 5ML   Culture  Setup Time      GRAM POSITIVE COCCI AEROBIC BOTTLE ONLY CRITICAL VALUE NOTED.  VALUE IS CONSISTENT WITH PREVIOUSLY REPORTED AND CALLED VALUE.    Culture GRAM POSITIVE COCCI    Report Status PENDING   Procalcitonin - Baseline     Status: None   Collection Time: 11/09/16  6:27 PM  Result Value Ref Range   Procalcitonin 0.72 ng/mL  Lactic acid, plasma     Status: None   Collection Time: 11/09/16  6:27 PM  Result Value Ref Range   Lactic Acid, Venous 1.4 0.5 - 1.9 mmol/L  Basic metabolic panel     Status: Abnormal   Collection Time: 11/10/16  4:47 AM  Result Value Ref Range   Sodium 140 135 - 145 mmol/L   Potassium 4.0 3.5 - 5.1 mmol/L   Chloride 113 (H) 101 - 111 mmol/L   CO2 19 (L) 22 - 32 mmol/L   Glucose, Bld 131 (H) 65 - 99 mg/dL   BUN 27 (H) 6 - 20 mg/dL   Creatinine, Ser 1.610.80 0.44 - 1.00 mg/dL   Calcium 8.3 (L) 8.9 - 10.3 mg/dL   GFR calc non Af Amer >60 >60 mL/min   GFR calc Af Amer >60 >60 mL/min   Anion gap 8 5 - 15  CBC     Status: Abnormal   Collection Time: 11/10/16  4:47 AM  Result Value Ref Range   WBC 22.1 (H) 3.6 - 11.0 K/uL   RBC 3.33 (L) 3.80 - 5.20 MIL/uL   Hemoglobin 8.4 (L) 12.0 - 16.0 g/dL   HCT 09.626.7 (  L) 35.0 - 47.0 %   MCV 80.2 80.0 - 100.0 fL   MCH 25.3 (L) 26.0 - 34.0 pg   MCHC 31.6 (L) 32.0 - 36.0 g/dL   RDW 16.1 (H) 09.6 - 04.5 %   Platelets 278 150 - 440 K/uL  MRSA PCR Screening     Status: Abnormal   Collection Time: 11/10/16  7:34 AM  Result Value Ref Range   MRSA by PCR POSITIVE (A) NEGATIVE    SUBJECTIVE:  No acute events.  MRSA + by PCR and  Enterococcus on blood culture.  OBJECTIVE:  GEN-  More responsive to name today and answers everything with "yes" this morning NECK-  Continued induration on right submandibular region but improved from yesterday with decrease inflammation and warmth  IMPRESSION:  Sepsis and sialoadenitis  PLAN:  Slightly improved from ENT perspective from yesterday.  Continue hydration as much as medicine is comfortable with given other comorbidities.  Agree with speech evaluation for swallowing.  No surgical role at this time.  Will sign off at this time.  Please re-consult if any concern or change.  Devri Kreher 11/10/2016, 11:38 AM

## 2016-11-11 ENCOUNTER — Inpatient Hospital Stay: Payer: Medicare Other

## 2016-11-11 ENCOUNTER — Inpatient Hospital Stay (HOSPITAL_COMMUNITY)
Admit: 2016-11-11 | Discharge: 2016-11-11 | Disposition: A | Discharge status: Home / Self Care | Payer: Medicare Other | Attending: Infectious Diseases | Admitting: Infectious Diseases

## 2016-11-11 DIAGNOSIS — R509 Fever, unspecified: Secondary | ICD-10-CM

## 2016-11-11 LAB — BASIC METABOLIC PANEL
ANION GAP: 7 (ref 5–15)
BUN: 28 mg/dL — ABNORMAL HIGH (ref 6–20)
CALCIUM: 8.4 mg/dL — AB (ref 8.9–10.3)
CO2: 17 mmol/L — AB (ref 22–32)
Chloride: 123 mmol/L — ABNORMAL HIGH (ref 101–111)
Creatinine, Ser: 0.71 mg/dL (ref 0.44–1.00)
GFR calc Af Amer: >60 mL/min (ref >60)
GFR calc non Af Amer: >60 mL/min (ref >60)
GLUCOSE: 148 mg/dL — AB (ref 65–99)
POTASSIUM: 4.2 mmol/L (ref 3.5–5.1)
Sodium: 147 mmol/L — ABNORMAL HIGH (ref 135–145)

## 2016-11-11 LAB — CBC
HEMATOCRIT: 24.3 % — AB (ref 35.0–47.0)
HEMOGLOBIN: 7.8 g/dL — AB (ref 12.0–16.0)
MCH: 25.4 pg — AB (ref 26.0–34.0)
MCHC: 32.2 g/dL (ref 32.0–36.0)
MCV: 79 fL — ABNORMAL LOW (ref 80.0–100.0)
Platelets: 304 10*3/uL (ref 150–440)
RBC: 3.08 MIL/uL — ABNORMAL LOW (ref 3.80–5.20)
RDW: 17.4 % — ABNORMAL HIGH (ref 11.5–14.5)
WBC: 11.8 10*3/uL — ABNORMAL HIGH (ref 3.6–11.0)

## 2016-11-11 LAB — PROCALCITONIN: Procalcitonin: 0.14 ng/mL

## 2016-11-11 MED ORDER — DEXTROSE 5 % IV SOLN
INTRAVENOUS | Status: AC
  Administered 2016-11-11 – 2016-11-12 (×2): via INTRAVENOUS

## 2016-11-11 MED ORDER — IPRATROPIUM-ALBUTEROL 0.5-2.5 (3) MG/3ML IN SOLN: 3.0000 mL | RESPIRATORY_TRACT | Status: DC | PRN

## 2016-11-11 NOTE — Progress Notes (Signed)
[facility] - [place] at [facility]   PATIENT NAME: Christine [name]    MR#:  [id number]  DATE OF BIRTH:  10-Mar-1918  SUBJECTIVE:   Patient here due to sepsis secondary to sialoadenitis. Or cultures positive for enterococcus. Had echocardiogram done today but results pending. Patient is demented at baseline and unable to provide a history. Patient's caretakers at bedside giving patient some pured food.  REVIEW OF SYSTEMS:    Review of Systems  Unable to perform ROS: Dementia    Nutrition: Dysphagia 1 Tolerating Diet: Yes but little Tolerating PT: Bedbound at baseline.      DRUG ALLERGIES:  No Known Allergies  VITALS:  Blood pressure 121/60, pulse (!) 102, temperature 98.8 F (37.1 C), temperature source Oral, resp. rate 20, height 5\' 3"  (1.6 m), weight 50.9 kg (112 lb 2 oz), SpO2 99 %.  PHYSICAL EXAMINATION:   Physical Exam  GENERAL:  82 y.o.-year-old cachectic patient lying in the bed in NAD.  EYES: Pupils equal, round, reactive to light and accommodation. No scleral icterus. Extraocular muscles intact.  HEENT: Head atraumatic, normocephalic. Oropharynx and nasopharynx clear.  NECK:   no jugular venous distention. No thyroid enlargement, Right submandibular gland swelling and firm to touch.  LUNGS: Normal breath sounds bilaterally, no wheezing, rales, rhonchi. No use of accessory muscles of respiration.  CARDIOVASCULAR: S1, S2 normal. No murmurs, rubs, or gallops.  ABDOMEN: Soft, nontender, nondistended. Bowel sounds present. No organomegaly or mass.  EXTREMITIES: No cyanosis, clubbing or edema b/l.    NEUROLOGIC: Cranial nerves II through XII are intact. No focal Motor or sensory deficits b/l. Globally weak.    PSYCHIATRIC: The patient is alert and oriented x 1.  SKIN: No obvious rash, lesion, or ulcer.    LABORATORY PANEL:   CBC  Recent Labs Lab 11/11/16 0522  WBC 11.8*  HGB 7.8*  HCT 24.3*  PLT 304    ------------------------------------------------------------------------------------------------------------------  Chemistries   Recent Labs Lab 11/09/16 1549  11/11/16 0522  NA 138  < > 147*  K 3.5  < > 4.2  CL 110  < > 123*  CO2 20*  < > 17*  GLUCOSE 105*  < > 148*  BUN 30*  < > 28*  CREATININE 0.91  < > 0.71  CALCIUM 8.8*  < > 8.4*  AST 25  --   --   ALT 10*  --   --   ALKPHOS 102  --   --   BILITOT 0.9  --   --   < > = values in this interval not displayed. ------------------------------------------------------------------------------------------------------------------  Cardiac Enzymes No results for input(s): TROPONINI in the last 168 hours. ------------------------------------------------------------------------------------------------------------------  RADIOLOGY:  Ct Soft Tissue Neck W Contrast  Result Date: 11/09/2016 CLINICAL DATA:  Right neck swelling. Sore throat and altered mental status. EXAM: CT NECK WITH CONTRAST TECHNIQUE: Multidetector CT imaging of the neck was performed using the standard protocol following the bolus administration of intravenous contrast. CONTRAST:  75mL ISOVUE-300 IOPAMIDOL (ISOVUE-300) INJECTION 61% COMPARISON:  None. FINDINGS: Pharynx and larynx: Submucosal edema in the lower pharynx and supraglottic larynx. Mild mass effect on the right lateral pharynx by the right submandibular space process. Right tonsillar calcification. Salivary glands: There is extensive heterogeneous soft tissue throughout the right submandibular space with both hypoenhancing and hyperenhancing material suggestive of phlegmon. The right submandibular gland is not discretely identified. Inflammatory stranding extends into the subcutaneous soft tissues of the face and upper neck. Stranding also extends posteriorly into the carotid  space. The airway remains patent. No discrete, well-defined drainable fluid collection is identified. The left submandibular gland and  right parotid gland are unremarkable. There is an 11 mm peripherally enhancing nodule in the superficial left parotid gland. A similar but smaller nodule measures 4 mm more inferiorly in the left parotid. The left parotid gland is slightly enlarged compared to the right. Thyroid: Unremarkable. Lymph nodes: Small but asymmetric right submandibular lymph nodes measure up to 5 mm in short axis. Small right level II lymph nodes measure up to 6 mm in short axis, and there are asymmetric right level III and IV lymph nodes which are sub 5 mm. These are all most likely reactive. Vascular: Major vascular structures of the neck appear patent. Extensive calcified plaque is present at both carotid bifurcations with at least moderate proximal ICA stenosis on the right and mild-to-moderate stenosis on the left. Limited intracranial: Partially visualized cerebral atrophy. Visualized orbits: Unremarkable. Mastoids and visualized paranasal sinuses: Clear. Skeleton: Moderately advanced disc and facet degeneration in the cervical spine. T2 compression fracture with 70% anterior vertebral body height loss. This is chronic in appearance, with ankylosis between the T2 and T3 vertebral bodies anteriorly and without evidence of significant paravertebral hematoma. Posterior T2 vertebral body retropulsion measures 4 mm. Upper chest: Partially visualized heterogeneous lung opacities in the posterior left upper lobe and superior segment of the left lower lobe with small volume left pleural fluid. Aortic atherosclerosis. Right subclavian approach pacemaker. Subcentimeter mediastinal lymph nodes. Other: None. IMPRESSION: 1. Extensive phlegmonous type changes in the right submandibular space which may reflect marked infectious sialadenitis. No organized abscess identified. 2. Pharyngeal and supraglottic edema.  Patent airway. 3. Small nodules in the left parotid gland, the larger measuring 11 mm. This may reflect a primary parotid neoplasm,  abnormal lymph node, or potentially focal inflammatory change in the setting of mild acute parotitis given mild asymmetric enlargement of the left parotid gland and edema extending into the left face. 4. Partially visualized lung opacities in the left upper and left lower lobes suspicious for pneumonia. Chest radiographs recommended. 5. Partially visualized left pleural effusion. 6. Aortic atherosclerosis. Carotid bifurcation atherosclerosis with right greater than left proximal ICA stenosis. 7. Moderate T2 compression fracture, chronic in appearance. Electronically Signed   By: Sebastian AcheAllen  Grady M.D.   On: 11/09/2016 17:34   Dg Chest Port 1 View  Result Date: 11/11/2016 CLINICAL DATA:  Aspiration pneumonia. EXAM: PORTABLE CHEST 1 VIEW COMPARISON:  None. FINDINGS: Right pacer in place with leads in the right atrium and right ventricle. Cardiomegaly with vascular congestion. Interstitial prominence throughout the lungs could reflect interstitial edema or chronic lung disease. Patchy opacities in the left lung, question pneumonia. IMPRESSION: Mild interstitial prominence could reflect interstitial edema or chronic lung disease. Patchy airspace disease throughout the left lung concerning for pneumonia. Electronically Signed   By: Charlett NoseKevin  Dover M.D.   On: 11/11/2016 07:52     ASSESSMENT AND PLAN:   82 year old female with past medical history of advanced dementia, hypertension, hypothyroidism, GERD, history of A-V block status post AICD, anxiety who presented to the hospital due to hoarseness cough and wheezing and was noted to have sialoadenitis with sepsis.  1. Sepsis-this is secondary to enterococcus and probably secondary to patient's underlying sialoadenitis. -Continue Unasyn, appreciate infectious disease input and his repeat blood cultures are negative will need a total of 2 weeks of abx.  Can likely be switched to oral though.   2. Sialoadenitis-appreciate ENT evaluation and no plans for  urgent  surgical intervention. -Continue supportive care with IV steroids, IV antibiotics. Speech therapy following and adjust diet as per them.  3. Hypothyroidism-continue Synthroid.  4. Depression-continue Lexapro.  5. Hypernatremia - due to dehydration and poor PO intake.  - will change IV fluids to D5W and monitor.   6. Dementia w/ behavioral disturbance - cont. Seroquel.   All the records are reviewed and case discussed with Care Management/Social Worker. Management plans discussed with the patient, family and they are in agreement.  CODE STATUS: DNR  DVT Prophylaxis: Lovenox.  TOTAL TIME TAKING CARE OF THIS PATIENT: 30 minutes.   POSSIBLE D/C IN 2-3 DAYS, DEPENDING ON CLINICAL CONDITION.   [place] SirenSAINANI,Lillianna Sabel J M.D on 11/11/2016 at 2:16 PM  Between 7am to 6pm - Pager - [phone]  After 6pm go to [email or web address] - Social research officer, governmentpassword EPAS [email or web address]  [employer] [place] [facility]  Office  [phone]  CC: Primary care physician; Dorothey BasemanAVID BRONSTEIN, MD

## 2016-11-11 NOTE — Progress Notes (Signed)
*  PRELIMINARY RESULTS* Echocardiogram 2D Echocardiogram has been performed. Unable to complete echo due to patient's uncooperativeness. A Limited Echo was performed.  Christine [name] [name] 11/11/2016, 11:08 AM

## 2016-11-12 LAB — CULTURE, BLOOD (ROUTINE X 2)

## 2016-11-12 LAB — CBC
HCT: 26.2 % — ABNORMAL LOW (ref 35.0–47.0)
Hemoglobin: 8.2 g/dL — ABNORMAL LOW (ref 12.0–16.0)
MCH: 24.8 pg — AB (ref 26.0–34.0)
MCHC: 31.4 g/dL — AB (ref 32.0–36.0)
MCV: 78.9 fL — AB (ref 80.0–100.0)
PLATELETS: 323 10*3/uL (ref 150–440)
RBC: 3.32 MIL/uL — ABNORMAL LOW (ref 3.80–5.20)
RDW: 17.3 % — ABNORMAL HIGH (ref 11.5–14.5)
WBC: 11.7 10*3/uL — AB (ref 3.6–11.0)

## 2016-11-12 LAB — BASIC METABOLIC PANEL
Anion gap: 6 (ref 5–15)
BUN: 27 mg/dL — AB (ref 6–20)
CALCIUM: 8.2 mg/dL — AB (ref 8.9–10.3)
CO2: 19 mmol/L — AB (ref 22–32)
CREATININE: 0.75 mg/dL (ref 0.44–1.00)
Chloride: 120 mmol/L — ABNORMAL HIGH (ref 101–111)
GFR calc Af Amer: >60 mL/min (ref >60)
GLUCOSE: 151 mg/dL — AB (ref 65–99)
Potassium: 4 mmol/L (ref 3.5–5.1)
Sodium: 145 mmol/L (ref 135–145)

## 2016-11-12 LAB — ECHOCARDIOGRAM COMPLETE
AV pk vel: 216 cm/s
AVPG: 19 mmHg
HEIGHTINCHES: 63 in
WEIGHTICAEL: 1794 [oz_av]

## 2016-11-12 MED ORDER — DEXTROSE 5 % IV SOLN
INTRAVENOUS | Status: AC
  Administered 2016-11-13: 05:00:00 via INTRAVENOUS

## 2016-11-12 NOTE — Consult Note (Signed)
WOC Nurse wound consult note Reason for Consult: Chronic non-healing ulcer on right foot, second digit with full thickness ulcer secondary to shoewear with hammertoe (caused by bunion on first metatarsal) Wound type:trauma Pressure Ulcer POA: Yes Measurement:1cnm round with hyperkeratotic skin creating elevation (scab) Wound ZOX:WRUEbed:None Drainage (amount, consistency, odor) None Periwound:Intact, no erythema, induration or warmth Dressing procedure/placement/frequency:I will add a preventive and conservative POC while here in house consisting of painting the affected area with a betadine swabstick and allowing it to air dry twice daily.  This antimicrobial and astringent effect will perhaps allow the hyperkeratotic area to life spontaneously and reveal healed tissue beneath. While the heels are intact, they are at risk for skin breakdown, so I have today provided bilateral pressure redistribution heel boots (Prevalon Boots). WOC nursing team will not follow, but will remain available to this patient, the nursing and medical teams.  Please re-consult if needed. Thanks, Ladona MowLaurie Najir Roop, MSN, RN, GNP, Hans EdenCWOCN, CWON-AP, FAAN  Pager# [phone]

## 2016-11-12 NOTE — Plan of Care (Signed)
Problem: Education: Goal: Knowledge of  General Education information/materials will improve Outcome: Not Progressing Pt alert to self. Followed simple commands.   Problem: Nutrition: Goal: Adequate nutrition will be maintained Outcome: Progressing Appetite improving. Pt ate 40% of breakfast, 20% of lunch. Dextrose 5% infusion continues.

## 2016-11-12 NOTE — Care Management Important Message (Signed)
Important Message  Patient Details  Name: Christine [name] MRN: [id number] Date of Birth: 1918-11-04   Medicare Important Message Given:  Yes    Eyvonne Burchfield A, RN 11/12/2016, 4:47 PM

## 2016-11-12 NOTE — Progress Notes (Signed)
[facility] -  [facility]   PATIENT NAME: Christine LeffBertha [name]    MR#:  [id number]  DATE OF BIRTH:  1918-08-03  SUBJECTIVE:   Patient here due to sepsis secondary to sialoadenitis. Blood cultures positive for enterococcus. Had echocardiogram done yesterday but results pending. Patient is demented at baseline and unable to provide a history. WBC count stable. No other acute events overnight.   REVIEW OF SYSTEMS:    Review of Systems  Unable to perform ROS: Dementia    Nutrition: Dysphagia 1 Tolerating Diet: Yes but little Tolerating PT: Bedbound at baseline.      DRUG ALLERGIES:  No Known Allergies  VITALS:  Blood pressure 138/72, pulse 96, temperature 97.9 F (36.6 C), temperature source Oral, resp. rate 20, height 5\' 3"  (1.6 m), weight 50.9 kg (112 lb 2 oz), SpO2 95 %.  PHYSICAL EXAMINATION:   Physical Exam  GENERAL:  82 y.o.-year-old cachectic patient lying in the bed in NAD.  EYES: Pupils equal, round, reactive to light and accommodation. No scleral icterus. Extraocular muscles intact.  HEENT: Head atraumatic, normocephalic. Oropharynx and nasopharynx clear.  NECK:   no jugular venous distention. No thyroid enlargement, Right submandibular gland swelling and firm to touch.  LUNGS: Normal breath sounds bilaterally, no wheezing, rales, rhonchi. No use of accessory muscles of respiration.  CARDIOVASCULAR: S1, S2 normal. No murmurs, rubs, or gallops.  ABDOMEN: Soft, nontender, nondistended. Bowel sounds present. No organomegaly or mass.  EXTREMITIES: No cyanosis, clubbing or edema b/l.    NEUROLOGIC: Cranial nerves II through XII are intact. No focal Motor or sensory deficits b/l. Globally weak and bedbound PSYCHIATRIC: The patient is alert and oriented x 1.  SKIN: No obvious rash, lesion, or ulcer.    LABORATORY PANEL:   CBC  Recent Labs Lab 11/12/16 0456  WBC 11.7*  HGB 8.2*  HCT 26.2*  PLT 323    ------------------------------------------------------------------------------------------------------------------  Chemistries   Recent Labs Lab 11/09/16 1549  11/12/16 0456  NA 138  < > 145  K 3.5  < > 4.0  CL 110  < > 120*  CO2 20*  < > 19*  GLUCOSE 105*  < > 151*  BUN 30*  < > 27*  CREATININE 0.91  < > 0.75  CALCIUM 8.8*  < > 8.2*  AST 25  --   --   ALT 10*  --   --   ALKPHOS 102  --   --   BILITOT 0.9  --   --   < > = values in this interval not displayed. ------------------------------------------------------------------------------------------------------------------  Cardiac Enzymes No results for input(s): TROPONINI in the last 168 hours. ------------------------------------------------------------------------------------------------------------------  RADIOLOGY:  Dg Chest Port 1 View  Result Date: 11/11/2016 CLINICAL DATA:  Aspiration pneumonia. EXAM: PORTABLE CHEST 1 VIEW COMPARISON:  None. FINDINGS: Right pacer in place with leads in the right atrium and right ventricle. Cardiomegaly with vascular congestion. Interstitial prominence throughout the lungs could reflect interstitial edema or chronic lung disease. Patchy opacities in the left lung, question pneumonia. IMPRESSION: Mild interstitial prominence could reflect interstitial edema or chronic lung disease. Patchy airspace disease throughout the left lung concerning for pneumonia. Electronically Signed   By: Charlett NoseKevin  Dover M.D.   On: 11/11/2016 07:52     ASSESSMENT AND PLAN:   82 year old female with past medical history of advanced dementia, hypertension, hypothyroidism, GERD, history of A-V block status post AICD, anxiety who presented to the hospital due to hoarseness cough and wheezing and was noted to  have sialoadenitis with sepsis.  1. Sepsis-this is secondary to enterococcus and probably secondary to patient's underlying sialoadenitis. -Continue Unasyn, appreciate infectious disease input and her  repeat blood cultures are negative so far.  Pt. will need a total of 2 weeks of abx.  Can likely be switched to oral if repeat BC are (-).  - Echo done but results are still pending. Will discuss with ID tomorrow regarding plan.    2. Sialoadenitis-appreciate ENT evaluation and no plans for urgent surgical intervention. -Continue supportive care with IV steroids, IV antibiotics. Speech therapy following and adjust diet as tolerated.  3. Hypothyroidism-continue Synthroid.  4. Depression-continue Lexapro.  5. Hypernatremia - due to dehydration and poor PO intake.  - improved w/ D5W and will cont. To monitor.   6. Dementia w/ behavioral disturbance - cont. Seroquel.   All the records are reviewed and case discussed with Care Management/Social Worker. Management plans discussed with the patient, family and they are in agreement.  CODE STATUS: DNR  DVT Prophylaxis: Lovenox.  TOTAL TIME TAKING CARE OF THIS PATIENT: 25 minutes.   POSSIBLE D/C IN 2-3 DAYS, DEPENDING ON CLINICAL CONDITION.   [place] SirenSAINANI,VIVEK J M.D on 11/12/2016 at 12:34 PM  Between 7am to 6pm - Pager - [phone]  After 6pm go to [email or web address] - Social research officer, governmentpassword EPAS [facility]  [employer] [place] [facility]  Office  [phone]  CC: Primary care physician; Dorothey BasemanAVID BRONSTEIN, MD

## 2016-11-13 ENCOUNTER — Inpatient Hospital Stay (HOSPITAL_COMMUNITY)
Admit: 2016-11-13 | Discharge: 2016-11-13 | Disposition: A | Discharge status: Home / Self Care | Payer: Medicare Other | Attending: Specialist | Admitting: Specialist

## 2016-11-13 DIAGNOSIS — I348 Other nonrheumatic mitral valve disorders: Secondary | ICD-10-CM

## 2016-11-13 LAB — ECHOCARDIOGRAM LIMITED
Height: 63 in
WEIGHTICAEL: 1794 [oz_av]

## 2016-11-13 LAB — PROCALCITONIN: Procalcitonin: <0.1 ng/mL

## 2016-11-13 NOTE — Progress Notes (Signed)
[employer] [facility] - [place] at [facility]   PATIENT NAME: Shela LeffBertha Givans    MR#:  [id number]  DATE OF BIRTH:  1918/05/04  SUBJECTIVE:   Patient here due to sepsis secondary to sialoadenitis. Blood cultures positive for enterococcus. Had echocardiogram done yesterday but poor quality study and will need to repeat today as ?? Mitral valve mass/calcification.  Mental status improving.   REVIEW OF SYSTEMS:    Review of Systems  Unable to perform ROS: Dementia    Nutrition: Dysphagia 1 Tolerating Diet: Yes but little Tolerating PT: Bedbound at baseline.      DRUG ALLERGIES:  No Known Allergies  VITALS:  Blood pressure (!) 130/95, pulse (!) 129, temperature 98.7 F (37.1 C), temperature source Oral, resp. rate 16, height 5\' 3"  (1.6 m), weight 50.9 kg (112 lb 2 oz), SpO2 98 %.  PHYSICAL EXAMINATION:   Physical Exam  GENERAL:  82 y.o.-year-old cachectic patient lying in the bed in NAD.  EYES: Pupils equal, round, reactive to light and accommodation. No scleral icterus. Extraocular muscles intact.  HEENT: Head atraumatic, normocephalic. Oropharynx and nasopharynx clear.  NECK:   no jugular venous distention. No thyroid enlargement, Right submandibular gland swelling and firm to touch.  LUNGS: Normal breath sounds bilaterally, no wheezing, rales, rhonchi. No use of accessory muscles of respiration.  CARDIOVASCULAR: S1, S2 normal. No murmurs, rubs, or gallops.  ABDOMEN: Soft, nontender, nondistended. Bowel sounds present. No organomegaly or mass.  EXTREMITIES: No cyanosis, clubbing or edema b/l.    NEUROLOGIC: Cranial nerves II through XII are intact. No focal Motor or sensory deficits b/l. Globally weak and bedbound PSYCHIATRIC: The patient is alert and oriented x 1.  SKIN: No obvious rash, lesion, or ulcer.    LABORATORY PANEL:   CBC  Recent Labs Lab 11/12/16 0456  WBC 11.7*  HGB 8.2*  HCT 26.2*  PLT 323    ------------------------------------------------------------------------------------------------------------------  Chemistries   Recent Labs Lab 11/09/16 1549  11/12/16 0456  NA 138  < > 145  K 3.5  < > 4.0  CL 110  < > 120*  CO2 20*  < > 19*  GLUCOSE 105*  < > 151*  BUN 30*  < > 27*  CREATININE 0.91  < > 0.75  CALCIUM 8.8*  < > 8.2*  AST 25  --   --   ALT 10*  --   --   ALKPHOS 102  --   --   BILITOT 0.9  --   --   < > = values in this interval not displayed. ------------------------------------------------------------------------------------------------------------------  Cardiac Enzymes No results for input(s): TROPONINI in the last 168 hours. ------------------------------------------------------------------------------------------------------------------  RADIOLOGY:  No results found.   ASSESSMENT AND PLAN:   82 year old female with past medical history of advanced dementia, hypertension, hypothyroidism, GERD, history of A-V block status post AICD, anxiety who presented to the hospital due to hoarseness cough and wheezing and was noted to have sialoadenitis with sepsis.  1. Sepsis-this is secondary to enterococcus and probably secondary to patient's underlying sialoadenitis. -Continue Unasyn, appreciate infectious disease input and her repeat blood cultures are negative so far. Await Repeat Echo results and if (-) then d/c on 4 week course of Oral Amoxicillin.   - follow up with ID in 4 weeks.   2. Sialoadenitis-appreciate ENT evaluation and no plans for urgent surgical intervention. -Continue supportive care with IV steroids, IV antibiotics. Speech therapy following and adjust diet as tolerated.  3. Hypothyroidism-continue Synthroid.  4. Depression-continue Lexapro.  5. Hypernatremia - due to dehydration and poor PO intake.  - improved w/ D5W.  6. Dementia w/ behavioral disturbance - cont. Seroquel.   Discussed plan of care w/ ID and pt's daughter over  the phone. Plan for discharge tomorrow to Nursing home if repeat Echo showing no vegetations or mass.   All the records are reviewed and case discussed with Care Management/Social Worker. Management plans discussed with the patient, family and they are in agreement.  CODE STATUS: DNR  DVT Prophylaxis: Lovenox.  TOTAL TIME TAKING CARE OF THIS PATIENT: 35 minutes.   POSSIBLE D/C IN 1-2 DAYS, DEPENDING ON CLINICAL CONDITION.   Houston SirenSAINANI,Xzander Gilham J M.D on 11/13/2016 at 4:36 PM  Between 7am to 6pm - Pager - [phone]  After 6pm go to [email or web address] - Social research officer, governmentpassword EPAS [facility]  [employer] [place] [facility]  Office  [phone]  CC: Primary care physician; Dorothey BasemanAVID BRONSTEIN, MD

## 2016-11-13 NOTE — Progress Notes (Signed)
Pharmacy Antibiotic Note  Christine LeffBertha [name] is a 82 y.o. female admitted on 11/09/2016 with Aspiration  pneumonia.  Pharmacy has been consulted for Unasyn dosing. Patient now found to have ENTEROCOCCUS FAECALIS Bacteremia.   Plan: CrCl and Scr remain stable. Will continue Unasyn to 3 g iv q 6 hours.   Height: 5\' 3"  (160 cm) Weight: 112 lb 2 oz (50.9 kg) IBW/kg (Calculated) : 52.4  Temp (24hrs), Avg:97.9 F (36.6 C), Min:97.8 F (36.6 C), Max:97.9 F (36.6 C)   Recent Labs Lab 11/09/16 1549 11/09/16 1827 11/10/16 0447 11/11/16 0522 11/12/16 0456  WBC 38.5*  --  22.1* 11.8* 11.7*  CREATININE 0.91  --  0.80 0.71 0.75  LATICACIDVEN  --  1.4  --   --   --     Estimated Creatinine Clearance: 33 mL/min (by C-G formula based on SCr of 0.75 mg/dL).    No Known Allergies  Antimicrobials this admission: 12/14 Unasyn  >>   Dose adjustments this admission:  Microbiology results: 12/14 BCx: pending 12/15 MRSA PCR: positive  Thank you for allowing pharmacy to be a part of this patient's care.  Gardner CandleSheema M Anzel Kearse, PharmD, BCPS Clinical Pharmacist 11/13/2016 8:44 AM

## 2016-11-13 NOTE — Progress Notes (Signed)
[facility] Date of Admission:  11/09/2016     ID: Christine [name] is a 82 y.o. female with enterococcal bacteremia Active Problems:   Sepsis (HCC)   Protein-calorie malnutrition, severe   Subjective: No fevers, much more awake  ROS  Unable to obtain  Medications:  Antibiotics Given (last 72 hours)    Date/Time Action Medication Dose Rate   11/10/16 1752 Given   Ampicillin-Sulbactam (UNASYN) 3 g in sodium chloride 0.9 % 100 mL IVPB 3 g 200 mL/hr   11/11/16 0100 Given   Ampicillin-Sulbactam (UNASYN) 3 g in sodium chloride 0.9 % 100 mL IVPB 3 g 200 mL/hr   11/11/16 0646 Given   Ampicillin-Sulbactam (UNASYN) 3 g in sodium chloride 0.9 % 100 mL IVPB 3 g 200 mL/hr   11/11/16 1232 Given   Ampicillin-Sulbactam (UNASYN) 3 g in sodium chloride 0.9 % 100 mL IVPB 3 g 200 mL/hr   11/11/16 1828 Given   Ampicillin-Sulbactam (UNASYN) 3 g in sodium chloride 0.9 % 100 mL IVPB 3 g 200 mL/hr   11/12/16 0003 Given   Ampicillin-Sulbactam (UNASYN) 3 g in sodium chloride 0.9 % 100 mL IVPB 3 g 200 mL/hr   11/12/16 0458 Given   Ampicillin-Sulbactam (UNASYN) 3 g in sodium chloride 0.9 % 100 mL IVPB 3 g 200 mL/hr   11/12/16 1331 Given   Ampicillin-Sulbactam (UNASYN) 3 g in sodium chloride 0.9 % 100 mL IVPB 3 g 200 mL/hr   11/12/16 1845 Given   Ampicillin-Sulbactam (UNASYN) 3 g in sodium chloride 0.9 % 100 mL IVPB 3 g 200 mL/hr   11/12/16 2318 Given   Ampicillin-Sulbactam (UNASYN) 3 g in sodium chloride 0.9 % 100 mL IVPB 3 g 200 mL/hr   11/13/16 0507 Given   Ampicillin-Sulbactam (UNASYN) 3 g in sodium chloride 0.9 % 100 mL IVPB 3 g 200 mL/hr   11/13/16 1256 Given   Ampicillin-Sulbactam (UNASYN) 3 g in sodium chloride 0.9 % 100 mL IVPB 3 g 200 mL/hr     . ampicillin-sulbactam (UNASYN) IV  3 g Intravenous Q6H  . Chlorhexidine Gluconate Cloth  6 each Topical Q0600  . dexamethasone  4 mg Intravenous Q8H  . enoxaparin (LOVENOX) injection  40 mg Subcutaneous Q24H  .  escitalopram  10 mg Oral Daily  . levothyroxine  50 mcg Oral QAC breakfast  . mouth rinse  15 mL Mouth Rinse BID  . mupirocin ointment  1 application Nasal BID  . pneumococcal 23 valent vaccine  0.5 mL Intramuscular Tomorrow-1000  . QUEtiapine  150 mg Oral QHS    Objective: Vital signs in last 24 hours: Temp:  [97.8 F (36.6 C)-98.7 F (37.1 C)] 98.7 F (37.1 C) (12/18 1442) Pulse Rate:  [73-129] 129 (12/18 0955) Resp:  [16-20] 16 (12/18 1442) BP: (126-133)/(69-95) 130/95 (12/18 1442) SpO2:  [97 %-98 %] 98 % (12/18 0955) Constitutional: frail, lying in bed, more awake, but disoriented HENT:anicteric Mouth/Throat: Oropharynx is clear but very dry R sub Mand region with improving firm  swelling  Cardiovascular: Normal rate, regular rhythm 3/6 sm PPM site wnl Pulmonary/Chest: Effort normal and breath sounds normal. No respiratory distress.  has no wheezes.  Neck = supple, no nuchal rigidity Abdominal: Soft. Bowel sounds are normal.  exhibits no distension. There is no tenderness.  Lymphadenopathy: no cervical adenopathy. No axillary adenopathy Neurological: awake, does not really answer questiosn Skin: Skin is warm and dry. No rash noted. No erythema.  Psychiatric: a normal mood and affect.  behavior is  normal.   Lab Results  Recent Labs  11/11/16 0522 11/12/16 0456  WBC 11.8* 11.7*  HGB 7.8* 8.2*  HCT 24.3* 26.2*  NA 147* 145  K 4.2 4.0  CL 123* 120*  CO2 17* 19*  BUN 28* 27*  CREATININE 0.71 0.75    Microbiology: Results for orders placed or performed during the hospital encounter of 11/09/16  CULTURE, BLOOD (ROUTINE X 2) w Reflex to ID Panel     Status: Abnormal   Collection Time: 11/09/16  5:55 PM  Result Value Ref Range Status   Specimen Description   Final    BLOOD LEFT ANTECUBITAL [facility]    Special Requests   Final    BOTTLES DRAWN AEROBIC AND ANAEROBIC ANA 16 AER 13ML   Culture  Setup Time   Final    GRAM POSITIVE COCCI IN  BOTH AEROBIC AND ANAEROBIC BOTTLES CRITICAL RESULT CALLED TO, READ BACK BY AND VERIFIED WITH: Hank Zompa @ 1046 11/10/16 [facility]    Culture ENTEROCOCCUS FAECALIS (A)  Final   Report Status 11/12/2016 FINAL  Final   Organism ID, Bacteria ENTEROCOCCUS FAECALIS  Final      Susceptibility   Enterococcus faecalis - MIC*    AMPICILLIN <=2 SENSITIVE Sensitive     VANCOMYCIN 2 SENSITIVE Sensitive     GENTAMICIN SYNERGY SENSITIVE Sensitive     * ENTEROCOCCUS FAECALIS  Blood Culture ID Panel (Reflexed)     Status: Abnormal   Collection Time: 11/09/16  5:55 PM  Result Value Ref Range Status   Enterococcus species DETECTED (A) NOT DETECTED Final    Comment: CRITICAL RESULT CALLED TO, READ BACK BY AND VERIFIED WITH: Hank Zompa @ 1046 11/10/16 by [facility]    Vancomycin resistance NOT DETECTED NOT DETECTED Final   Listeria monocytogenes NOT DETECTED NOT DETECTED Final   Staphylococcus species NOT DETECTED NOT DETECTED Final   Staphylococcus aureus NOT DETECTED NOT DETECTED Final   Streptococcus species NOT DETECTED NOT DETECTED Final   Streptococcus agalactiae NOT DETECTED NOT DETECTED Final   Streptococcus pneumoniae NOT DETECTED NOT DETECTED Final   Streptococcus pyogenes NOT DETECTED NOT DETECTED Final   Acinetobacter baumannii NOT DETECTED NOT DETECTED Final   Enterobacteriaceae species NOT DETECTED NOT DETECTED Final   Enterobacter cloacae complex NOT DETECTED NOT DETECTED Final   Escherichia coli NOT DETECTED NOT DETECTED Final   Klebsiella oxytoca NOT DETECTED NOT DETECTED Final   Klebsiella pneumoniae NOT DETECTED NOT DETECTED Final   Proteus species NOT DETECTED NOT DETECTED Final   Serratia marcescens NOT DETECTED NOT DETECTED Final   Haemophilus influenzae NOT DETECTED NOT DETECTED Final   Neisseria meningitidis NOT DETECTED NOT DETECTED Final   Pseudomonas aeruginosa NOT DETECTED NOT DETECTED Final   Candida albicans NOT DETECTED NOT DETECTED Final   Candida glabrata NOT DETECTED NOT  DETECTED Final   Candida krusei NOT DETECTED NOT DETECTED Final   Candida parapsilosis NOT DETECTED NOT DETECTED Final   Candida tropicalis NOT DETECTED NOT DETECTED Final  CULTURE, BLOOD (ROUTINE X 2) w Reflex to ID Panel     Status: Abnormal   Collection Time: 11/09/16  6:00 PM  Result Value Ref Range Status   Specimen Description BLOOD RIGHT HAND  Final   Special Requests   Final    BOTTLES DRAWN AEROBIC AND ANAEROBIC  ANA 3ML AER 5ML   Culture  Setup Time   Final    GRAM POSITIVE COCCI AEROBIC BOTTLE ONLY CRITICAL VALUE NOTED.  VALUE IS  CONSISTENT WITH PREVIOUSLY REPORTED AND CALLED VALUE.    Culture (A)  Final    ENTEROCOCCUS FAECALIS SUSCEPTIBILITIES PERFORMED ON PREVIOUS CULTURE WITHIN THE LAST 5 DAYS. Performed at [facility]    Report Status 11/12/2016 FINAL  Final  MRSA PCR Screening     Status: Abnormal   Collection Time: 11/10/16  7:34 AM  Result Value Ref Range Status   MRSA by PCR POSITIVE (A) NEGATIVE Final    Comment:        The GeneXpert MRSA Assay (FDA approved for NASAL specimens only), is one component of a comprehensive MRSA colonization surveillance program. It is not intended to diagnose MRSA infection nor to guide or monitor treatment for MRSA infections. RESULT CALLED TO, READ BACK BY AND VERIFIED WITH: Ellison Hughs @ [phone] 11/10/16 by [facility]   Culture, blood (single) w Reflex to ID Panel     Status: None (Preliminary result)   Collection Time: 11/10/16  2:02 PM  Result Value Ref Range Status   Specimen Description BLOOD LEFT HAND  Final   Special Requests   Final    BOTTLES DRAWN AEROBIC AND ANAEROBIC AER ANA   Culture NO GROWTH 3 DAYS  Final   Report Status PENDING  Incomplete    Studies/Results: No results found.  Assessment/Plan: Christine [name] is a 82 y.o. female with dementia admitted with leukocytosis, cough, possible aspiration pneumonia and sialoadenitis who also has enterococcal bacteremia.  She does have a permanent  pacemaker in place which in the setting of bacteremia may have been seeded.   White count has improved with IV antibiotics. Echo shows a possible mass. Poor quality. Repeat bcx are negative.  Clinically she is much improved and while she is at risk of endocarditis I think  even if she has Endocarditis is will be very hard to treat her with PICC line and IV abx.  Recommendations Would repeat echo At this point if bcx remain neg and otherwise stable would dc on a 4 week course of oral amoxicllin 1000 mg bid I can see in 4 weeks time and decide on continuing treatment orally or stopping abx and monitoring with serial blood cultures   Thank you very much for the consult. Will follow with you.  Docia Klar P   11/13/2016, 3:49 PM

## 2016-11-13 NOTE — Progress Notes (Signed)
*  PRELIMINARY RESULTS* Echocardiogram 2D Echocardiogram has been performed.  Cristela BlueHege, Shayn Madole 11/13/2016, 1:48 PM

## 2016-11-13 NOTE — Progress Notes (Signed)
Speech Language Pathology Treatment: Dysphagia  Patient Details Name: Christine LeffBertha [name] MRN: [id number] DOB: Apr 28, 1918 Today's Date: 11/13/2016 Time: [id number] SLP Time Calculation (min) (ACUTE ONLY): 45 min  Assessment / Plan / Recommendation Clinical Impression  Pt seen today for toleration of diet. Pt is more talkative/alert but w/ significant confusion noted in communication content. Pt was able to respond to verbal cues to attend to conversation and was able to give min information re: self [place]. Pt required 100% feeding support and was fed trials of her Dysphagia 1 level diet w/ thin liquids. No overt s/s of aspiration were noted during intake; oral phase adequate for bolus management and clearing of trials. However, pt did not open mouth widely for bolus acceptance - appeared guarded when fed keeping mouth more closed. Pt only accepted 1/4 tsp amounts of foods; sips of liquids fed to her. She required supervision for general aspiration precautions and encouragement w/ po's. Recommend Dietician f/u w/ supplements (drink consistency?) at meals. Recommend continue w/ current Dysphagia level 1 diet w/ thin liquids d/t her (baseline) Cognitive status of Dementia - this presentation can increase risk for aspiration to occur. An increased textured diet requires more Cognitive focus for mastication and bolus management. ST services will f/u w/ pt's status while admitted; precautions posted in room. NSG updated.      HPI HPI: Pt is a 82 y.o. female with a known history of Dementia, GERD, HTN, pacemaker. Patient unable to give any history at this time. [place] in the [place]. On Tuesday she started getting hoarse. On Wednesday wheeze and a cough. Chest x-ray showed pneumonitis and was started on a Z-Pak and breathing treatments. This a.m. they noticed some swelling and edema around her neck and she is spitting up cottage cheese-type material. They sent her to ENT Dr.  Gregary CromerVaught's office and she was sent into the ER for admission. In the ER blood pressure was on the lower side, white count elevated and tachycardic and hospitalist services were contacted for admission area normally she is able to communicate but is total care as per nurse at the facility. Unsure of pt's level of need for ADLs. Pt frequently calling in an agitated manner and could not be calmed easily. SHe did not follow instructions; did not give her name.       SLP Plan  Continue with current plan of care     Recommendations  Diet recommendations: Dysphagia 1 (puree);Thin liquid Liquids provided via: Cup;Straw Medication Administration: Whole meds with puree Supervision: Staff to assist with self feeding;Full supervision/cueing for compensatory strategies (d/t declined Cognitive status) Compensations: Minimize environmental distractions;Slow rate;Small sips/bites;Follow solids with liquid Postural Changes and/or Swallow Maneuvers: Seated upright 90 degrees;Upright 30-60 min after meal                General recommendations:  (Dietician f/u w/ supplements TID meals) Oral Care Recommendations: Oral care BID;Staff/trained caregiver to provide oral care Follow up Recommendations:  (TBD) Plan: Continue with current plan of care       GO                Watson,Katherine 11/13/2016, 5:31 PM

## 2016-11-14 MED ORDER — PREDNISONE 10 MG PO TABS: ORAL_TABLET | ORAL | 0 refills | Status: AC

## 2016-11-14 MED ORDER — AMOXICILLIN 500 MG PO TABS: 1000.0000 mg | ORAL_TABLET | Freq: Two times a day (BID) | ORAL | 0 refills | Status: AC

## 2016-11-14 NOTE — Progress Notes (Signed)
Patient is medically stable for D/C back to [facility] today. Per Jomarie LongsJoseph [facility] liaison patient will go to room 207-B. RN will call report to RN Lonzo Cloudicole Blackwell at [phone] and arrange EMS for transport. Clinical Child psychotherapistocial Worker (CSW) sent D/C orders to [facility] via [facility]. CSW contacted patient's daughter Clydie BraunKaren and made her aware of above. Please reconsult if future social work needs arise. CSW signing off.   Baker Hughes IncorporatedBailey Zabria Liss, LCSW [phone]

## 2016-11-14 NOTE — Progress Notes (Deleted)
Speech Language Pathology Treatment: Dysphagia  Patient Details Name: Christine [name] MRN: [id number] DOB: [birth year] Today's Date: 11/14/2016 Time: [id number] SLP Time Calculation (min) (ACUTE ONLY): 15 min  Assessment / Plan / Recommendation Clinical Impression  Skilled treatment session focused on dysphagia goals. SLP facilitated session by providing skilled observation of consumption of puree with thin liquids. Pt's son present and very pleased with pt's intake this morning. Pt more alert and interactive during session. Pt required Mod A verbal cues for redirection of attention to tasks. Education provided on decreasing environmental distractions, slow rate and small bites. Pt without overt s/s of aspiration with current diet.  Will defer further diet upgrade to next venue of care.    HPI HPI: Pt is a 82 y.o. female with a known history of Dementia, GERD, HTN, pacemaker. Patient unable to give any history at this time. [place] the [place]. On Tuesday she started getting hoarse. On Wednesday wheeze and a cough. Chest x-ray showed pneumonitis and was started on a Z-Pak and breathing treatments. This a.m. they noticed some swelling and edema around her neck and she is spitting up cottage cheese-type material. They sent her to ENT Dr. Gregary CromerVaught's office and she was sent into the ER for admission. In the ER blood pressure was on the lower side, white count elevated and tachycardic and hospitalist services were contacted for admission area normally she is able to communicate but is total care as per nurse at the facility. Unsure of pt's level of need for ADLs. Pt frequently calling in an agitated manner and could not be calmed easily. SHe did not follow instructions; did not give her name.       SLP Plan  Continue with current plan of care     Recommendations  Diet recommendations: Dysphagia 1 (puree);Thin liquid Liquids provided via: Cup;Straw Medication Administration:  Whole meds with puree Supervision: Staff to assist with self feeding;Full supervision/cueing for compensatory strategies Compensations: Minimize environmental distractions;Slow rate;Small sips/bites;Follow solids with liquid Postural Changes and/or Swallow Maneuvers: Seated upright 90 degrees;Upright 30-60 min after meal                Oral Care Recommendations: Oral care BID;Staff/trained caregiver to provide oral care Follow up Recommendations: Skilled Nursing facility Plan: Continue with current plan of care       GO                Christine [name] 11/14/2016, 10:15 AM

## 2016-11-14 NOTE — Discharge Summary (Signed)
[facility] [place] at [facility]   PATIENT NAME: Christine [name]    MR#:  [id number]  DATE OF BIRTH:  08-10-1918  DATE OF ADMISSION:  11/09/2016 ADMITTING PHYSICIAN: Alford Highland, MD  DATE OF DISCHARGE: 11/14/2016  PRIMARY CARE PHYSICIAN: Dorothey Baseman, MD    ADMISSION DIAGNOSIS:  ams  DISCHARGE DIAGNOSIS:  Active Problems:   Sepsis (HCC)   Protein-calorie malnutrition, severe   SECONDARY DIAGNOSIS:   Past Medical History:  Diagnosis Date  . Anemia   . Anxiety   . Atrioventricular block, complete (HCC)   . GERD (gastroesophageal reflux disease)   . Hypertension   . Thyroid disease   . Unspecified dementia with behavioral disturbance     HOSPITAL COURSE:   82 year old female with past medical history of advanced dementia, hypertension, hypothyroidism, GERD, history of A-V block status post AICD, anxiety who presented to the hospital due to hoarseness cough and wheezing and was noted to have sialoadenitis with sepsis.  1. Sepsis-this was secondary to enterococcus and probably secondary to patient's underlying sialoadenitis. - pt. Was treated with IV Unasyn and has clinically improved. Repeat BC are (-) so far.  - Echo showing no evidence of vegetations or endocarditis or any pacer wire seeding.  Seen by ID and now being discharged on a 4 week course of Oral Amoxicillin.  - follow up with ID in 4 weeks.   2. Sialoadenitis-appreciate ENT evaluation and they had no plans for urgent surgical intervention. Pt. Was treated with IV steroids, IV Unasyn and has improved.  - now being discharged on Oral amoxicillin & Pred. Taper.  -seen by speech and placed on Pureed diet and tolerating it so far and now being discharged back to SNF.   3. Hypothyroidism- she will continue Synthroid.  4. Depression- she will continue Lexapro.  5. Hypernatremia - resolved w/ D5w and can further followed as outpatient.   6. Dementia w/ behavioral disturbance - she  will cont. Seroquel  DISCHARGE CONDITIONS:   Stable.   CONSULTS OBTAINED:  Treatment Team:  Mick Sell, MD  DRUG ALLERGIES:  No Known Allergies  DISCHARGE MEDICATIONS:   Allergies as of 11/14/2016   No Known Allergies     Medication List    STOP taking these medications   azithromycin 250 MG tablet Commonly known as:  ZITHROMAX   nitrofurantoin (macrocrystal-monohydrate) 100 MG capsule Commonly known as:  MACROBID     TAKE these medications   acetaminophen 500 MG tablet Commonly known as:  TYLENOL Take 1,000 mg by mouth every 6 (six) hours as needed.   amLODipine 2.5 MG tablet Commonly known as:  NORVASC Take 2.5 mg by mouth daily.   amoxicillin 500 MG tablet Commonly known as:  AMOXIL Take 2 tablets (1,000 mg total) by mouth 2 (two) times daily.   calcium carbonate 1500 (600 Ca) MG Tabs tablet Commonly known as:  OSCAL Take by mouth 2 (two) times daily with a meal.   cholecalciferol 1000 units tablet Commonly known as:  VITAMIN D Take 1,000 Units by mouth daily.   Cranberry 450 MG Caps Take 1 tablet by mouth daily.   ELIQUIS 2.5 MG Tabs tablet Generic drug:  apixaban Take 2.5 mg by mouth 2 (two) times daily.   escitalopram 10 MG tablet Commonly known as:  LEXAPRO Take 10 mg by mouth daily.   famotidine 10 MG tablet Commonly known as:  PEPCID Take 10 mg by mouth 2 (two) times daily.   ferrous sulfate 325 (65  FE) MG tablet Take 325 mg by mouth 3 (three) times daily with meals.   ipratropium-albuterol 0.5-2.5 (3) MG/3ML Soln Commonly known as:  DUONEB Take 3 mLs by nebulization every 4 (four) hours as needed.   levothyroxine 50 MCG tablet Commonly known as:  SYNTHROID, LEVOTHROID Take 50 mcg by mouth daily before breakfast.   Melatonin 3 MG Tabs Take by mouth.   multivitamin with minerals tablet Take 1 tablet by mouth daily.   NUTRITIONAL SHAKE PO Take 4 oz by mouth 3 (three) times daily.   nystatin cream Commonly known as:   MYCOSTATIN Apply 1 application topically 2 (two) times daily.   predniSONE 10 MG tablet Commonly known as:  DELTASONE Label  & dispense according to the schedule below. 5 Pills PO for 1 day then, 4 Pills PO for 1 day, 3 Pills PO for 1 day, 2 Pills PO for 1 day, 1 Pill PO for 1 days then STOP.   QUEtiapine 300 MG tablet Commonly known as:  SEROQUEL Take 150 mg by mouth at bedtime.   QUEtiapine 100 MG tablet Commonly known as:  SEROQUEL Take 100 mg by mouth 2 (two) times daily.   senna-docusate 8.6-50 MG tablet Commonly known as:  Senokot-S Take 1 tablet by mouth 2 (two) times daily.   triamcinolone 0.025 % cream Commonly known as:  KENALOG Apply 1 application topically 2 (two) times daily.   vitamin B-12 1000 MCG tablet Commonly known as:  CYANOCOBALAMIN Take 1,000 mcg by mouth daily.         DISCHARGE INSTRUCTIONS:   DIET:   Diet Recommendation at Discharge:  Dysphagia 1(puree) w/ Thin liquids; aspiration precautions; Pills given in Applesauce. Drink supplement TID meals (ie. Ensure, etc). Feeding support at meals.    DISCHARGE CONDITION:  Stable  ACTIVITY:  Activity as tolerated  OXYGEN:  Home Oxygen: No.   Oxygen Delivery: room air  DISCHARGE LOCATION:  nursing home   If you experience worsening of your admission symptoms, develop shortness of breath, life threatening emergency, suicidal or homicidal thoughts you must seek medical attention immediately by calling 911 or calling your MD immediately  if symptoms less severe.  You Must read complete instructions/literature along with all the possible adverse reactions/side effects for all the Medicines you take and that have been prescribed to you. Take any new Medicines after you have completely understood and accpet all the possible adverse reactions/side effects.   Please note  You were cared for by a hospitalist during your hospital stay. If you have any questions about your discharge medications or the  care you received while you were in the hospital after you are discharged, you can call the unit and asked to speak with the hospitalist on call if the hospitalist that took care of you is not available. Once you are discharged, your primary care physician will handle any further medical issues. Please note that NO REFILLS for any discharge medications will be authorized once you are discharged, as it is imperative that you return to your primary care physician (or establish a relationship with a primary care physician if you do not have one) for your aftercare needs so that they can reassess your need for medications and monitor your lab values.     Today   No fever overnight. Limited Echo showing no vegetations Tolerating pureed diet but intake poor.   VITAL SIGNS:  Blood pressure 116/77, pulse 62, temperature 97.9 F (36.6 C), temperature source Oral, resp. rate 16, height 5'  3" (1.6 m), weight 50.9 kg (112 lb 2 oz), SpO2 99 %.  I/O:  No intake or output data in the 24 hours ending 11/14/16 0924  PHYSICAL EXAMINATION:   GENERAL:  82 y.o.-year-old cachectic patient lying in the bed in NAD.  EYES: Pupils equal, round, reactive to light and accommodation. No scleral icterus. Extraocular muscles intact.  HEENT: Head atraumatic, normocephalic. Oropharynx and nasopharynx clear.  NECK:   no jugular venous distention. No thyroid enlargement, Right submandibular gland swelling and firm to touch. Non-tender  LUNGS: Normal breath sounds bilaterally, no wheezing, rales, rhonchi. No use of accessory muscles of respiration.  CARDIOVASCULAR: S1, S2 normal. No murmurs, rubs, or gallops.  ABDOMEN: Soft, nontender, nondistended. Bowel sounds present. No organomegaly or mass.  EXTREMITIES: No cyanosis, clubbing or edema b/l.    NEUROLOGIC: Cranial nerves II through XII are intact. No focal Motor or sensory deficits b/l. Globally weak and bedbound PSYCHIATRIC: The patient is alert and oriented x 1.  SKIN:  No obvious rash, lesion, or ulcer  DATA REVIEW:   CBC  Recent Labs Lab 11/12/16 0456  WBC 11.7*  HGB 8.2*  HCT 26.2*  PLT 323    Chemistries   Recent Labs Lab 11/09/16 1549  11/12/16 0456  NA 138  < > 145  K 3.5  < > 4.0  CL 110  < > 120*  CO2 20*  < > 19*  GLUCOSE 105*  < > 151*  BUN 30*  < > 27*  CREATININE 0.91  < > 0.75  CALCIUM 8.8*  < > 8.2*  AST 25  --   --   ALT 10*  --   --   ALKPHOS 102  --   --   BILITOT 0.9  --   --   < > = values in this interval not displayed.  Cardiac Enzymes No results for input(s): TROPONINI in the last 168 hours.  Microbiology Results  Results for orders placed or performed during the hospital encounter of 11/09/16  CULTURE, BLOOD (ROUTINE X 2) w Reflex to ID Panel     Status: Abnormal   Collection Time: 11/09/16  5:55 PM  Result Value Ref Range Status   Specimen Description   Final    BLOOD LEFT ANTECUBITAL [facility]    Special Requests   Final    BOTTLES DRAWN AEROBIC AND ANAEROBIC ANA 16 AER 13ML   Culture  Setup Time   Final    GRAM POSITIVE COCCI IN BOTH AEROBIC AND ANAEROBIC BOTTLES CRITICAL RESULT CALLED TO, READ BACK BY AND VERIFIED WITH: Hank Zompa @ 1046 11/10/16 [facility]    Culture ENTEROCOCCUS FAECALIS (A)  Final   Report Status 11/12/2016 FINAL  Final   Organism ID, Bacteria ENTEROCOCCUS FAECALIS  Final      Susceptibility   Enterococcus faecalis - MIC*    AMPICILLIN <=2 SENSITIVE Sensitive     VANCOMYCIN 2 SENSITIVE Sensitive     GENTAMICIN SYNERGY SENSITIVE Sensitive     * ENTEROCOCCUS FAECALIS  Blood Culture ID Panel (Reflexed)     Status: Abnormal   Collection Time: 11/09/16  5:55 PM  Result Value Ref Range Status   Enterococcus species DETECTED (A) NOT DETECTED Final    Comment: CRITICAL RESULT CALLED TO, READ BACK BY AND VERIFIED WITH: Hank Zompa @ 1046 11/10/16 by [facility]    Vancomycin resistance NOT DETECTED NOT DETECTED Final   Listeria monocytogenes NOT DETECTED NOT  DETECTED Final   Staphylococcus  species NOT DETECTED NOT DETECTED Final   Staphylococcus aureus NOT DETECTED NOT DETECTED Final   Streptococcus species NOT DETECTED NOT DETECTED Final   Streptococcus agalactiae NOT DETECTED NOT DETECTED Final   Streptococcus pneumoniae NOT DETECTED NOT DETECTED Final   Streptococcus pyogenes NOT DETECTED NOT DETECTED Final   Acinetobacter baumannii NOT DETECTED NOT DETECTED Final   Enterobacteriaceae species NOT DETECTED NOT DETECTED Final   Enterobacter cloacae complex NOT DETECTED NOT DETECTED Final   Escherichia coli NOT DETECTED NOT DETECTED Final   Klebsiella oxytoca NOT DETECTED NOT DETECTED Final   Klebsiella pneumoniae NOT DETECTED NOT DETECTED Final   Proteus species NOT DETECTED NOT DETECTED Final   Serratia marcescens NOT DETECTED NOT DETECTED Final   Haemophilus influenzae NOT DETECTED NOT DETECTED Final   Neisseria meningitidis NOT DETECTED NOT DETECTED Final   Pseudomonas aeruginosa NOT DETECTED NOT DETECTED Final   Candida albicans NOT DETECTED NOT DETECTED Final   Candida glabrata NOT DETECTED NOT DETECTED Final   Candida krusei NOT DETECTED NOT DETECTED Final   Candida parapsilosis NOT DETECTED NOT DETECTED Final   Candida tropicalis NOT DETECTED NOT DETECTED Final  CULTURE, BLOOD (ROUTINE X 2) w Reflex to ID Panel     Status: Abnormal   Collection Time: 11/09/16  6:00 PM  Result Value Ref Range Status   Specimen Description BLOOD RIGHT HAND  Final   Special Requests   Final    BOTTLES DRAWN AEROBIC AND ANAEROBIC  ANA AER   Culture  Setup Time   Final    GRAM POSITIVE COCCI AEROBIC BOTTLE ONLY CRITICAL VALUE NOTED.  VALUE IS CONSISTENT WITH PREVIOUSLY REPORTED AND CALLED VALUE.    Culture (A)  Final    ENTEROCOCCUS FAECALIS SUSCEPTIBILITIES PERFORMED ON PREVIOUS CULTURE WITHIN THE LAST 5 DAYS. Performed at [facility]    Report Status 11/12/2016 FINAL  Final  MRSA PCR Screening     Status: Abnormal    Collection Time: 11/10/16  7:34 AM  Result Value Ref Range Status   MRSA by PCR POSITIVE (A) NEGATIVE Final    Comment:        The GeneXpert MRSA Assay (FDA approved for NASAL specimens only), is one component of a comprehensive MRSA colonization surveillance program. It is not intended to diagnose MRSA infection nor to guide or monitor treatment for MRSA infections. RESULT CALLED TO, READ BACK BY AND VERIFIED WITH: Ellison Hughs @ [phone] 11/10/16 by [facility]   Culture, blood (single) w Reflex to ID Panel     Status: None (Preliminary result)   Collection Time: 11/10/16  2:02 PM  Result Value Ref Range Status   Specimen Description BLOOD LEFT HAND  Final   Special Requests   Final    BOTTLES DRAWN AEROBIC AND ANAEROBIC AER ANA   Culture NO GROWTH 4 DAYS  Final   Report Status PENDING  Incomplete    RADIOLOGY:  No results found.    Management plans discussed with the patient, family and they are in agreement.  CODE STATUS:     Code Status Orders        Start     Ordered   11/09/16 1755  Do not attempt resuscitation (DNR)  Continuous    Question Answer Comment  In the event of cardiac or respiratory ARREST Do not call a "code blue"   In the event of cardiac or respiratory ARREST Do not perform Intubation, CPR, defibrillation or ACLS   In the event of  cardiac or respiratory ARREST Use medication by any route, position, wound care, and other measures to relive pain and suffering. May use oxygen, suction and manual treatment of airway obstruction as needed for comfort.   Comments nurse may pronounce      11/09/16 1755    Code Status History    Date Active Date Inactive Code Status Order ID Comments User Context   This patient has a current code status but no historical code status.    Advance Directive Documentation   Flowsheet Row Most Recent Value  Type of Advance Directive  Out of facility DNR (pink MOST or yellow form)  Pre-existing out of facility DNR order  (yellow form or pink MOST form)  No data  "MOST" Form in Place?  No data      TOTAL TIME TAKING CARE OF THIS PATIENT: 40 minutes.    Houston SirenSAINANI,Tomeshia Pizzi J M.D on 11/14/2016 at 9:24 AM  Between 7am to 6pm - Pager - [phone]  After 6pm go to [email or web address] - Social research officer, governmentpassword EPAS [facility]  [employer] [place] [facility]  Office  [phone]  CC: Primary care physician; Dorothey BasemanAVID BRONSTEIN, MD

## 2016-11-14 NOTE — Progress Notes (Signed)
Received MD order to discharge patient to [facility] room 207-B , report called Lonzo CloudNicole Blackwell RN , patient transported via EMS on stretcher

## 2016-11-15 LAB — CULTURE, BLOOD (SINGLE): Culture: NO GROWTH

## 2016-11-27 ENCOUNTER — Encounter: Payer: Self-pay | Admitting: Infectious Diseases

## 2016-11-27 DIAGNOSIS — K1120 Sialoadenitis, unspecified: Secondary | ICD-10-CM | POA: Insufficient documentation

## 2018-01-13 IMAGING — CT CT NECK W/ CM
2 of 3 series · 12 of 28 positions shown, 15 images · IV contrast (iopamidol)
Comparison: None.

CLINICAL DATA: Right neck swelling. Sore throat and altered mental
status.

EXAM:
CT NECK WITH CONTRAST
TECHNIQUE: Multidetector CT imaging of the neck was performed using the
standard protocol following the bolus administration of intravenous
contrast.
CONTRAST:  75mL [id number] IOPAMIDOL ([id number]) INJECTION 61%

[Series 2: axial neck · axial · 0.66mm/px · z∈[+101,+245]mm · 7 of 96 slices shown, 9 images]
[im 12/96  soft-tissue]
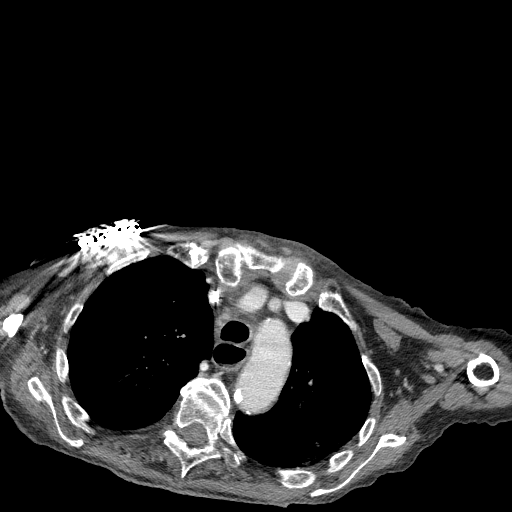
[im 12/96  bone]
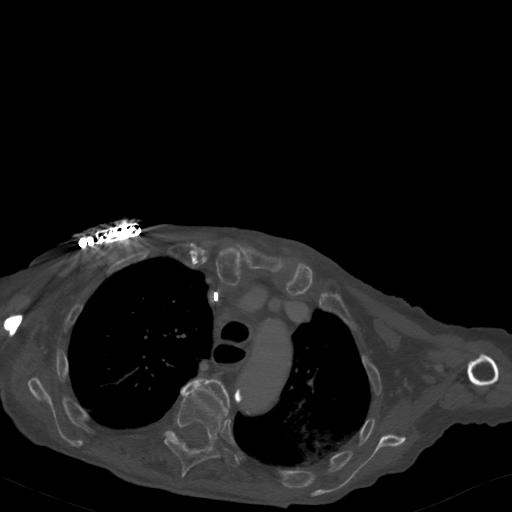
[im 24/96  bone]
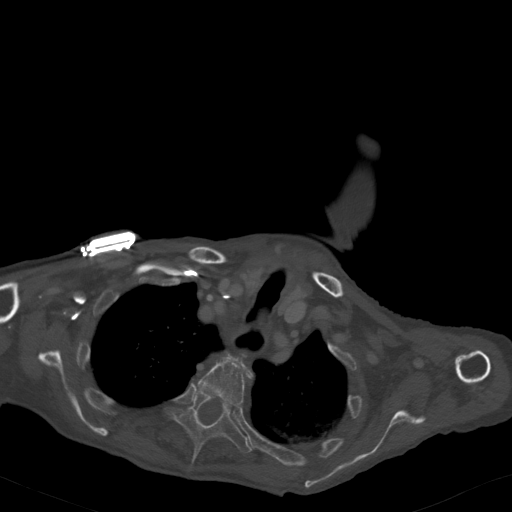
[im 36/96  bone]
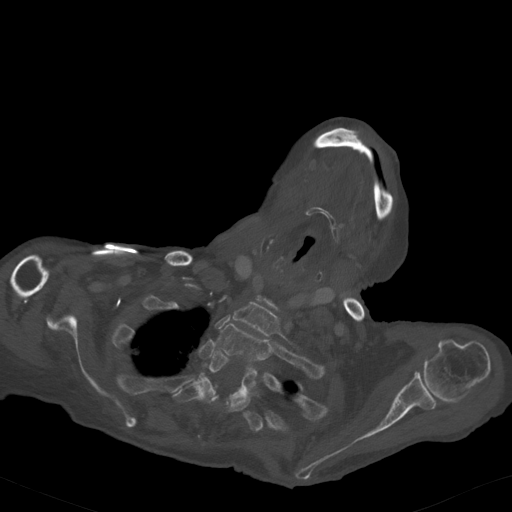
[im 48/96  bone]
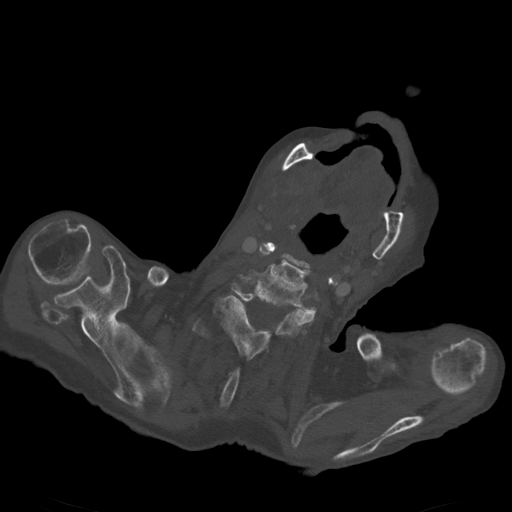
[im 60/96  soft-tissue]
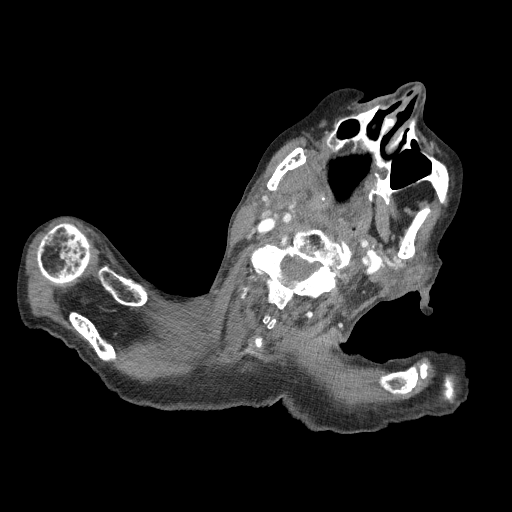
[im 60/96  bone]
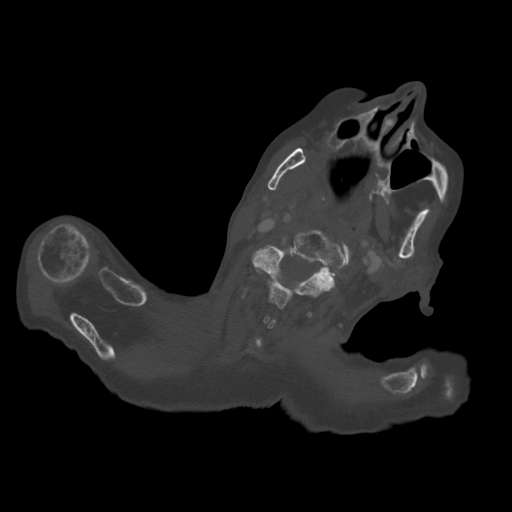
[im 72/96  bone]
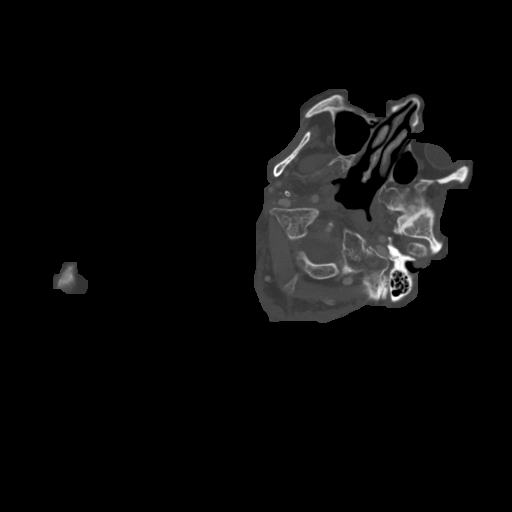
[im 84/96  bone]
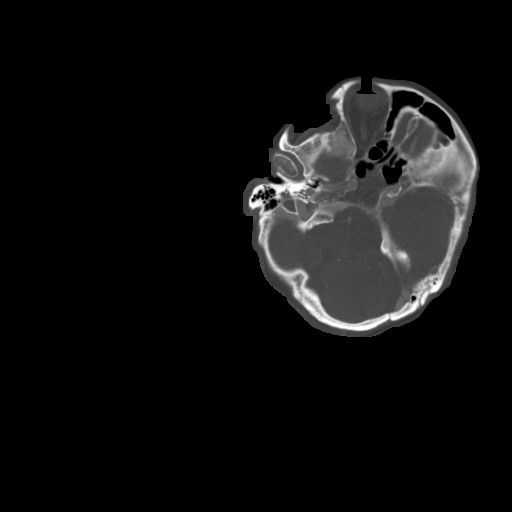

[Series 6: sag neck · sagittal · 0.45mm/px · 5 of 75 slices shown, 6 images]
[im 25/75  bone]
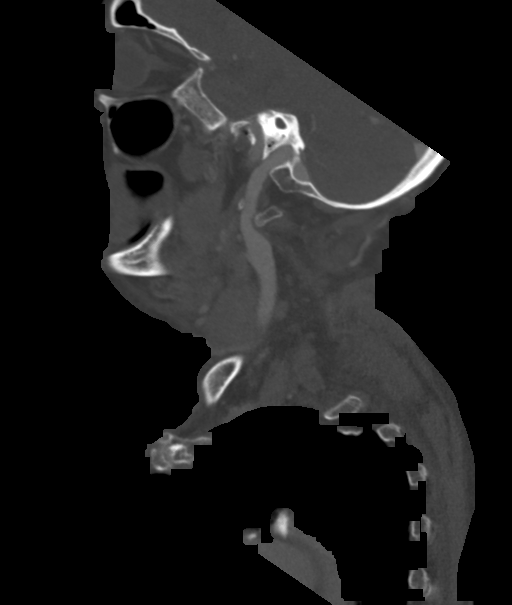
[im 31/75  bone]
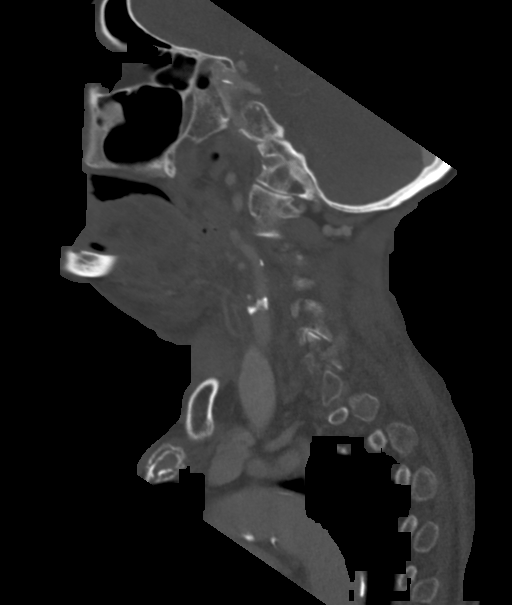
[im 38/75  soft-tissue]
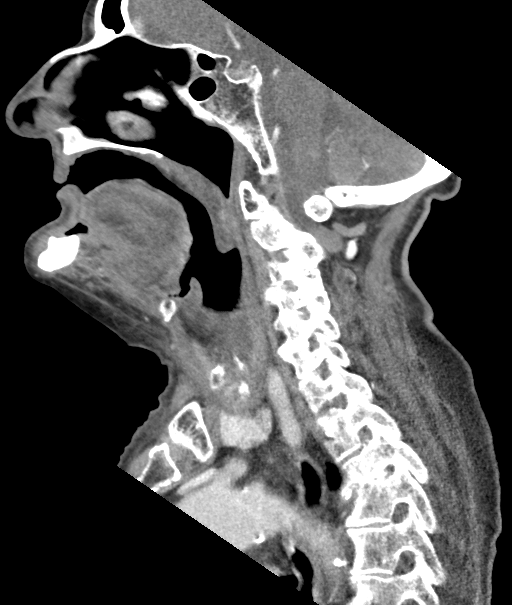
[im 38/75  bone]
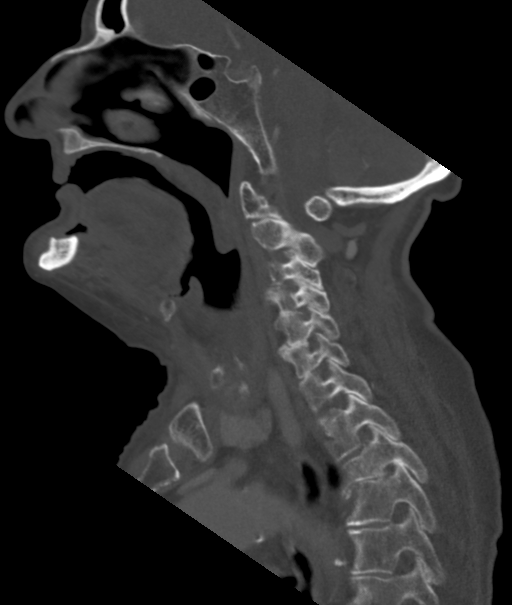
[im 44/75  bone]
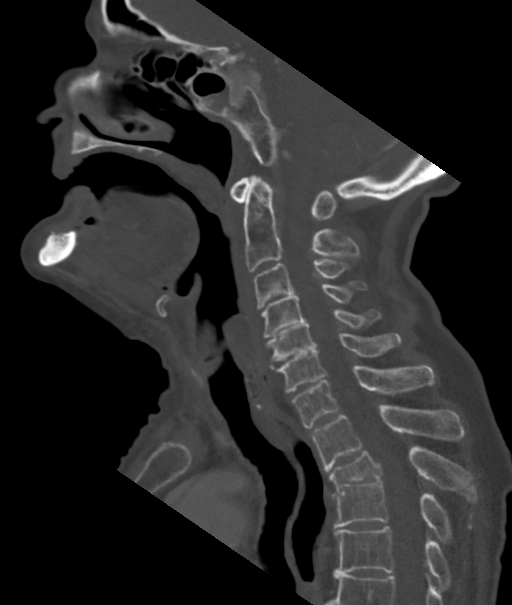
[im 50/75  bone]
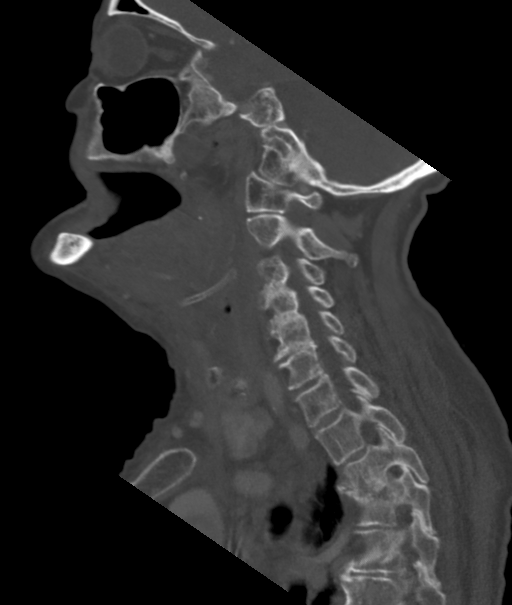

[12 of 28 positions shown; findings below may reference images not displayed]

FINDINGS: Pharynx and larynx: Submucosal edema in the lower pharynx and
supraglottic larynx. Mild mass effect on the right lateral pharynx
by the right submandibular space process. Right tonsillar
calcification.

Salivary glands: There is extensive heterogeneous soft tissue
throughout the right submandibular space with both hypoenhancing and
hyperenhancing material suggestive of phlegmon. The right
submandibular gland is not discretely identified. Inflammatory
stranding extends into the subcutaneous soft tissues of the face and
upper neck. Stranding also extends posteriorly into the carotid
space. The airway remains patent. No discrete, well-defined
drainable fluid collection is identified. The left submandibular
gland and right parotid gland are unremarkable. There is an 11 mm
peripherally enhancing nodule in the superficial left parotid gland.
A similar but smaller nodule measures 4 mm more inferiorly in the
left parotid. The left parotid gland is slightly enlarged compared
to the right.

Thyroid: Unremarkable.

Lymph nodes: Small but asymmetric right submandibular lymph nodes
measure up to 5 mm in short axis. Small right level II lymph nodes
measure up to 6 mm in short axis, and there are asymmetric right
level III and IV lymph nodes which are sub 5 mm. These are all most
likely reactive.

Vascular: Major vascular structures of the neck appear patent.
Extensive calcified plaque is present at both carotid bifurcations
with at least moderate proximal ICA stenosis on the right and
mild-to-moderate stenosis on the left.

Limited intracranial: Partially visualized cerebral atrophy.

Visualized orbits: Unremarkable.

Mastoids and visualized paranasal sinuses: Clear.

Skeleton: Moderately advanced disc and facet degeneration in the
cervical spine. T2 compression fracture with 70% anterior vertebral
body height loss. This is chronic in appearance, with ankylosis
between the T2 and T3 vertebral bodies anteriorly and without
evidence of significant paravertebral hematoma. Posterior T2
vertebral body retropulsion measures 4 mm.

Upper chest: Partially visualized heterogeneous lung opacities in
the posterior left upper lobe and superior segment of the left lower
lobe with small volume left pleural fluid. Aortic atherosclerosis.
Right subclavian approach pacemaker. Subcentimeter mediastinal lymph
nodes.

Other: None.
IMPRESSION: 1. Extensive phlegmonous type changes in the right submandibular
space which may reflect marked infectious sialadenitis. No organized
abscess identified.
2. Pharyngeal and supraglottic edema.  Patent airway.
3. Small nodules in the left parotid gland, the larger measuring 11
mm. This may reflect a primary parotid neoplasm, abnormal lymph
node, or potentially focal inflammatory change in the setting of
mild acute parotitis given mild asymmetric enlargement of the left
parotid gland and edema extending into the left face.
4. Partially visualized lung opacities in the left upper and left
lower lobes suspicious for pneumonia. Chest radiographs recommended.
5. Partially visualized left pleural effusion.
6. Aortic atherosclerosis. Carotid bifurcation atherosclerosis with
right greater than left proximal ICA stenosis.
7. Moderate T2 compression fracture, chronic in appearance.

## 2018-01-15 IMAGING — DX DG CHEST 1V PORT
1 series · 1 of 1 positions shown · non-contrast
Comparison: None.

CLINICAL DATA: Aspiration pneumonia.

EXAM:
PORTABLE CHEST 1 VIEW

[chest ap]
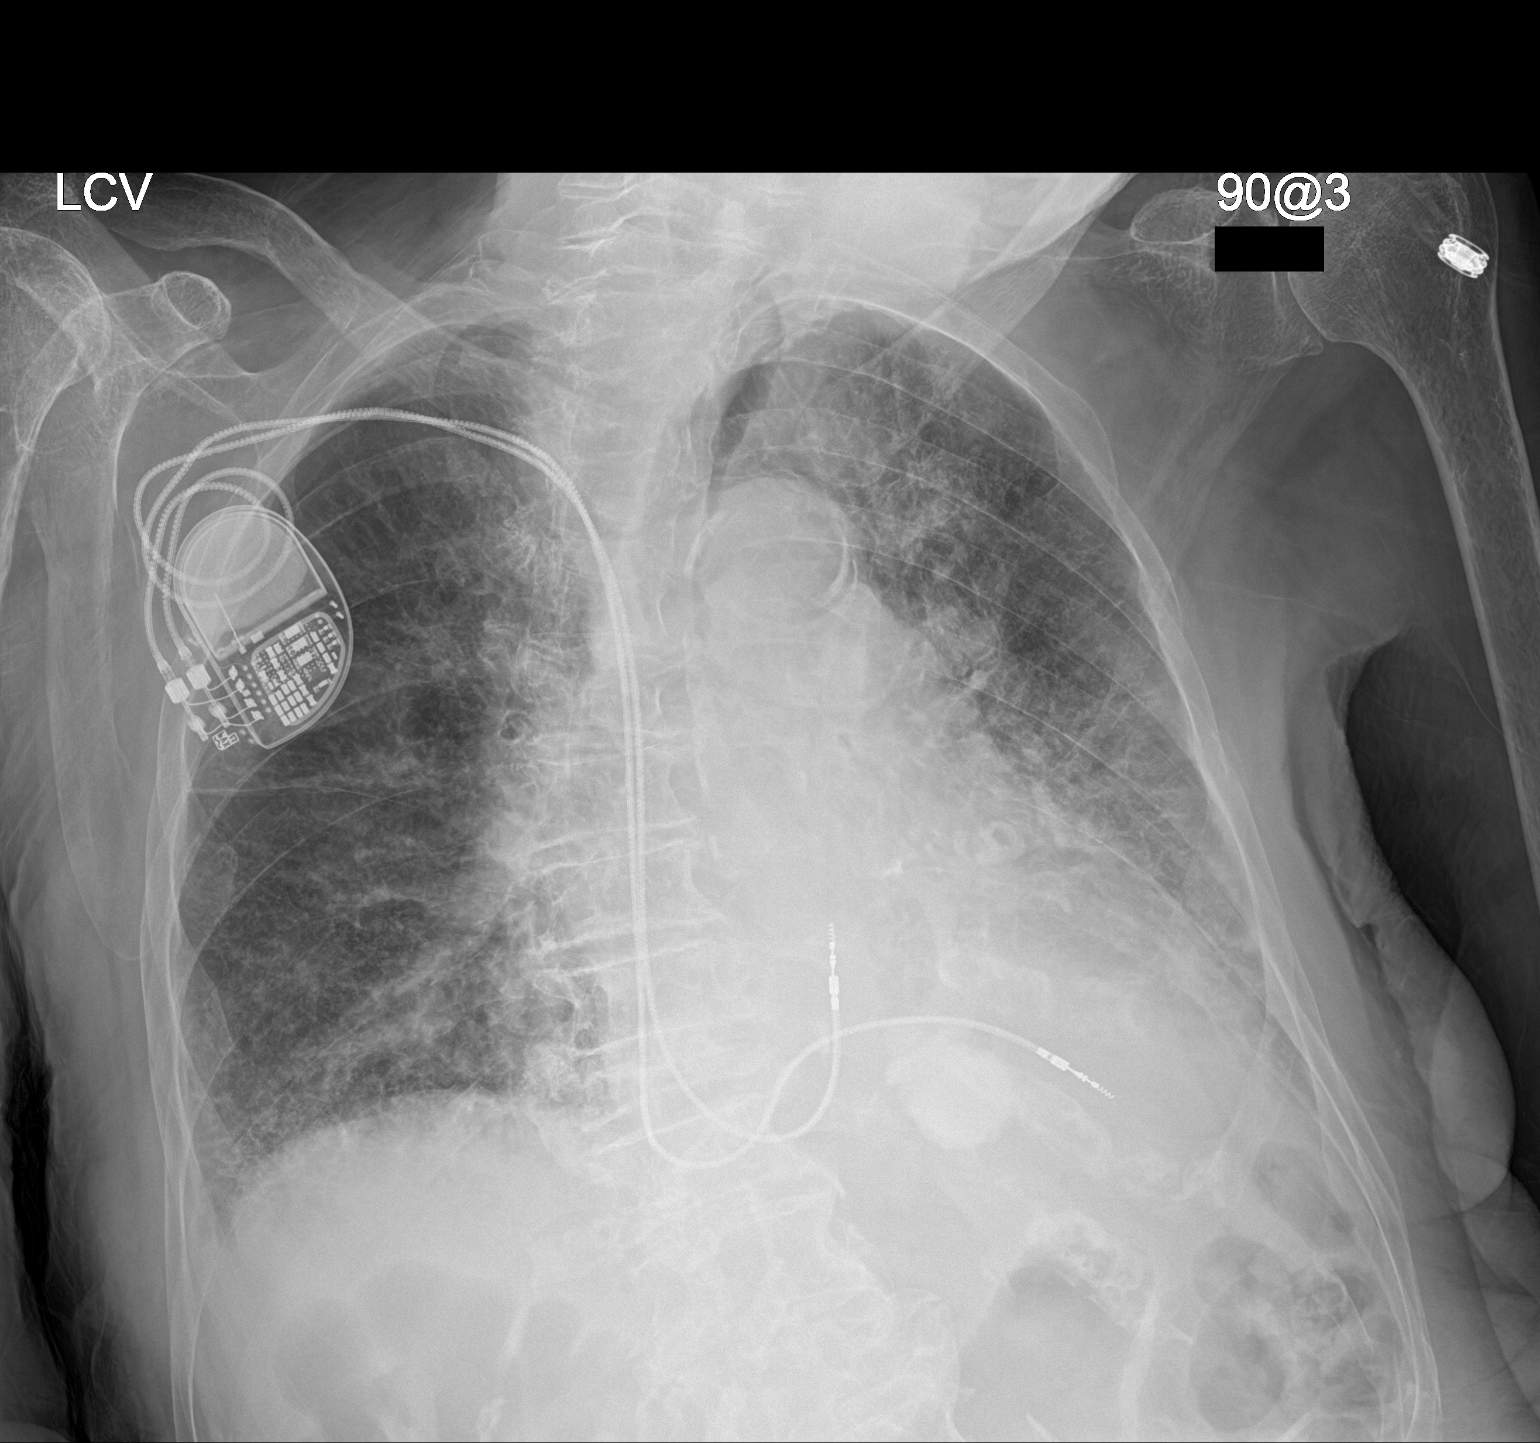

[1 of 1 positions shown; findings below may reference images not displayed]

FINDINGS: Right pacer in place with leads in the right atrium and right
ventricle. Cardiomegaly with vascular congestion. Interstitial
prominence throughout the lungs could reflect interstitial edema or
chronic lung disease. Patchy opacities in the left lung, question
pneumonia.
IMPRESSION: Mild interstitial prominence could reflect interstitial edema or
chronic lung disease.

Patchy airspace disease throughout the left lung concerning for
pneumonia.

## 2018-01-30 ENCOUNTER — Other Ambulatory Visit
Admission: RE | Admit: 2018-01-30 | Discharge: 2018-01-30 | Disposition: A | Discharge status: Home / Self Care | Source: Ambulatory Visit | Attending: Family Medicine | Admitting: Family Medicine

## 2018-01-30 DIAGNOSIS — R509 Fever, unspecified: Secondary | ICD-10-CM | POA: Diagnosis not present

## 2018-01-30 LAB — INFLUENZA PANEL BY PCR (TYPE A & B)
INFLBPCR: NEGATIVE
Influenza A By PCR: NEGATIVE

## 2018-02-25 DEATH — deceased
